# Patient Record
Sex: Female | Born: 1970 | Race: White | Hispanic: Yes | Marital: Married | Smoking: Never smoker
Health system: Southern US, Community
[De-identification: ages and names within clinical notes are randomized; demographics above are authoritative.]

## PROBLEM LIST (undated history)

## (undated) DIAGNOSIS — E785 Hyperlipidemia, unspecified: Secondary | ICD-10-CM

## (undated) HISTORY — DX: Hyperlipidemia, unspecified: E78.5

## (undated) HISTORY — PX: NO PAST SURGERIES: SHX2092

---

## 2010-02-19 ENCOUNTER — Encounter (INDEPENDENT_AMBULATORY_CARE_PROVIDER_SITE_OTHER): Payer: Self-pay | Admitting: Family Medicine

## 2010-02-19 ENCOUNTER — Ambulatory Visit: Payer: Self-pay | Admitting: Internal Medicine

## 2010-02-19 LAB — CONVERTED CEMR LAB
AST: 35 units/L (ref 0–37)
BUN: 11 mg/dL (ref 6–23)
Basophils Relative: 0 % (ref 0–1)
Calcium: 9.7 mg/dL (ref 8.4–10.5)
Chloride: 104 meq/L (ref 96–112)
Creatinine, Ser: 0.71 mg/dL (ref 0.40–1.20)
Eosinophils Absolute: 0.2 10*3/uL (ref 0.0–0.7)
HCT: 41 % (ref 36.0–46.0)
Hemoglobin: 14.5 g/dL (ref 12.0–15.0)
MCHC: 35.4 g/dL (ref 30.0–36.0)
MCV: 89.5 fL (ref 78.0–100.0)
Monocytes Absolute: 0.9 10*3/uL (ref 0.1–1.0)
Monocytes Relative: 10 % (ref 3–12)
Neutro Abs: 4.1 10*3/uL (ref 1.7–7.7)
RBC: 4.58 M/uL (ref 3.87–5.11)

## 2010-02-20 ENCOUNTER — Encounter (INDEPENDENT_AMBULATORY_CARE_PROVIDER_SITE_OTHER): Payer: Self-pay | Admitting: Family Medicine

## 2010-02-20 LAB — CONVERTED CEMR LAB
Hep B Core Total Ab: NEGATIVE
Hep B S Ab: NEGATIVE

## 2010-04-02 ENCOUNTER — Ambulatory Visit: Payer: Self-pay | Admitting: Internal Medicine

## 2010-04-02 ENCOUNTER — Encounter (INDEPENDENT_AMBULATORY_CARE_PROVIDER_SITE_OTHER): Payer: Self-pay | Admitting: Family Medicine

## 2010-04-02 LAB — CONVERTED CEMR LAB
ALT: 56 units/L — ABNORMAL HIGH (ref 0–35)
Cholesterol: 193 mg/dL (ref 0–200)
LDL Cholesterol: 116 mg/dL — ABNORMAL HIGH (ref 0–99)
VLDL: 40 mg/dL (ref 0–40)

## 2010-07-11 ENCOUNTER — Encounter (INDEPENDENT_AMBULATORY_CARE_PROVIDER_SITE_OTHER): Payer: Self-pay | Admitting: *Deleted

## 2010-07-11 LAB — CONVERTED CEMR LAB
Cholesterol: 190 mg/dL (ref 0–200)
Triglycerides: 133 mg/dL (ref <150)
VLDL: 27 mg/dL (ref 0–40)

## 2010-12-25 ENCOUNTER — Other Ambulatory Visit: Payer: Self-pay

## 2016-06-05 ENCOUNTER — Emergency Department (HOSPITAL_COMMUNITY)
Admission: EM | Admit: 2016-06-05 | Discharge: 2016-06-05 | Disposition: A | Discharge status: Home / Self Care | Payer: Medicaid Other | Attending: Emergency Medicine | Admitting: Emergency Medicine

## 2016-06-05 ENCOUNTER — Encounter (HOSPITAL_COMMUNITY): Payer: Self-pay

## 2016-06-05 ENCOUNTER — Emergency Department (HOSPITAL_COMMUNITY): Payer: Medicaid Other

## 2016-06-05 DIAGNOSIS — S339XXA Sprain of unspecified parts of lumbar spine and pelvis, initial encounter: Secondary | ICD-10-CM | POA: Diagnosis not present

## 2016-06-05 DIAGNOSIS — Y939 Activity, unspecified: Secondary | ICD-10-CM | POA: Diagnosis not present

## 2016-06-05 DIAGNOSIS — Y999 Unspecified external cause status: Secondary | ICD-10-CM | POA: Diagnosis not present

## 2016-06-05 DIAGNOSIS — S0990XA Unspecified injury of head, initial encounter: Secondary | ICD-10-CM | POA: Diagnosis present

## 2016-06-05 DIAGNOSIS — S060X0A Concussion without loss of consciousness, initial encounter: Secondary | ICD-10-CM | POA: Diagnosis not present

## 2016-06-05 DIAGNOSIS — Y92410 Unspecified street and highway as the place of occurrence of the external cause: Secondary | ICD-10-CM | POA: Diagnosis not present

## 2016-06-05 DIAGNOSIS — S335XXA Sprain of ligaments of lumbar spine, initial encounter: Secondary | ICD-10-CM

## 2016-06-05 DIAGNOSIS — S161XXA Strain of muscle, fascia and tendon at neck level, initial encounter: Secondary | ICD-10-CM | POA: Insufficient documentation

## 2016-06-05 LAB — I-STAT BETA HCG BLOOD, ED (MC, WL, AP ONLY)

## 2016-06-05 MED ORDER — HYDROCODONE-ACETAMINOPHEN 5-325 MG PO TABS
1.0000 | ORAL_TABLET | Freq: Once | ORAL | Status: AC
  Administered 2016-06-05: 1 via ORAL
  Filled 2016-06-05: qty 1

## 2016-06-05 MED ORDER — IBUPROFEN 800 MG PO TABS: 800.0000 mg | ORAL_TABLET | Freq: Three times a day (TID) | ORAL | 0 refills | Status: DC

## 2016-06-05 MED ORDER — HYDROCODONE-ACETAMINOPHEN 5-325 MG PO TABS: 2.0000 | ORAL_TABLET | Freq: Four times a day (QID) | ORAL | 0 refills | Status: DC | PRN

## 2016-06-05 MED ORDER — CYCLOBENZAPRINE HCL 10 MG PO TABS: 10.0000 mg | ORAL_TABLET | Freq: Two times a day (BID) | ORAL | 0 refills | Status: DC | PRN

## 2016-06-05 MED ORDER — DIAZEPAM 5 MG/ML IJ SOLN
5.0000 mg | Freq: Once | INTRAMUSCULAR | Status: AC
  Administered 2016-06-05: 5 mg via INTRAVENOUS
  Filled 2016-06-05: qty 2

## 2016-06-05 NOTE — ED Notes (Signed)
Triage note completed with video interpreter/[id number]

## 2016-06-05 NOTE — ED Notes (Signed)
Patient d/c'd with assistance of interpreter.  Patient medications and F/U reviewed.  Patient verbalized understanding.

## 2016-06-05 NOTE — ED Notes (Signed)
LSB and head block removed as per protocol. Patient denies any pain of the spine when palpated. Per interperter that pain is with movement.
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
## 2016-06-05 NOTE — ED Notes (Signed)
MD working on D/C paperwork.

## 2016-08-12 ENCOUNTER — Encounter: Payer: Self-pay | Admitting: Family Medicine

## 2016-08-12 ENCOUNTER — Ambulatory Visit: Payer: Medicaid Other | Attending: Family Medicine | Admitting: Family Medicine

## 2016-08-12 VITALS — BP 118/73 | HR 67 | Temp 98.4°F | Resp 18 | Ht 61.0 in | Wt 158.0 lb

## 2016-08-12 DIAGNOSIS — E785 Hyperlipidemia, unspecified: Secondary | ICD-10-CM | POA: Diagnosis present

## 2016-08-12 DIAGNOSIS — Z13228 Encounter for screening for other metabolic disorders: Secondary | ICD-10-CM | POA: Diagnosis not present

## 2016-08-12 DIAGNOSIS — Z1329 Encounter for screening for other suspected endocrine disorder: Secondary | ICD-10-CM | POA: Insufficient documentation

## 2016-08-12 DIAGNOSIS — L80 Vitiligo: Secondary | ICD-10-CM | POA: Insufficient documentation

## 2016-08-12 DIAGNOSIS — L298 Other pruritus: Secondary | ICD-10-CM

## 2016-08-12 DIAGNOSIS — L299 Pruritus, unspecified: Secondary | ICD-10-CM | POA: Diagnosis not present

## 2016-08-12 DIAGNOSIS — N898 Other specified noninflammatory disorders of vagina: Secondary | ICD-10-CM

## 2016-08-12 LAB — CBC WITH DIFFERENTIAL/PLATELET
BASOS PCT: 0 %
Basophils Absolute: 0 cells/uL (ref 0–200)
EOS ABS: 124 {cells}/uL (ref 15–500)
EOS PCT: 2 %
HCT: 44.6 % (ref 35.0–45.0)
Hemoglobin: 15.1 g/dL (ref 11.7–15.5)
LYMPHS PCT: 35 %
Lymphs Abs: 2170 cells/uL (ref 850–3900)
MCH: 31 pg (ref 27.0–33.0)
MCHC: 33.9 g/dL (ref 32.0–36.0)
MCV: 91.6 fL (ref 80.0–100.0)
MONOS PCT: 9 %
MPV: 9.7 fL (ref 7.5–12.5)
Monocytes Absolute: 558 cells/uL (ref 200–950)
NEUTROS ABS: 3348 {cells}/uL (ref 1500–7800)
Neutrophils Relative %: 54 %
PLATELETS: 331 10*3/uL (ref 140–400)
RBC: 4.87 MIL/uL (ref 3.80–5.10)
RDW: 12.9 % (ref 11.0–15.0)
WBC: 6.2 10*3/uL (ref 3.8–10.8)

## 2016-08-12 LAB — LIPID PANEL W/REFLEX DIRECT LDL
CHOL/HDL RATIO: 5.4 ratio — AB (ref <5.0)
CHOLESTEROL: 195 mg/dL (ref <200)
HDL: 36 mg/dL — ABNORMAL LOW (ref >50)
LDL-CHOLESTEROL: 119 mg/dL — AB
NON-HDL CHOLESTEROL (CALC): 159 mg/dL — AB (ref <130)
Triglycerides: 282 mg/dL — ABNORMAL HIGH (ref <150)

## 2016-08-12 LAB — COMPLETE METABOLIC PANEL WITH GFR
ALT: 24 U/L (ref 6–29)
AST: 20 U/L (ref 10–35)
Albumin: 4.5 g/dL (ref 3.6–5.1)
Alkaline Phosphatase: 63 U/L (ref 33–115)
BUN: 9 mg/dL (ref 7–25)
CALCIUM: 9.5 mg/dL (ref 8.6–10.2)
CHLORIDE: 103 mmol/L (ref 98–110)
CO2: 26 mmol/L (ref 20–31)
Creat: 0.49 mg/dL — ABNORMAL LOW (ref 0.50–1.10)
GFR, Est African American: >89 mL/min (ref >=60)
GFR, Est Non African American: >89 mL/min (ref >=60)
GLUCOSE: 115 mg/dL — AB (ref 65–99)
POTASSIUM: 4.2 mmol/L (ref 3.5–5.3)
SODIUM: 139 mmol/L (ref 135–146)
Total Bilirubin: 0.5 mg/dL (ref 0.2–1.2)
Total Protein: 7.3 g/dL (ref 6.1–8.1)

## 2016-08-12 MED ORDER — CLOTRIMAZOLE 1 % EX CREA: 1.0000 "application " | TOPICAL_CREAM | Freq: Two times a day (BID) | CUTANEOUS | 1 refills | Status: AC

## 2016-08-12 NOTE — Progress Notes (Signed)
Subjective:  Patient ID: Debbie Lawson, female    DOB: [birth year]  Age: 46 y.o. MRN: [id number]  CC: Hyperlipidemia   HPI Debbie Lawson is a 46 year old female who presents today to establish care. She complains of a hypopigmented rash which appears and disappears on her left forearm, right abdomen and external genitalia. Rash in the external genitalia is pruritic but the others are not.  She denies vaginal discharge or urinary symptoms.  No past medical history on file.  No past surgical history on file.  No Known Allergies  Outpatient Medications Prior to Visit  Medication Sig Dispense Refill  . Cholecalciferol (VITAMIN D PO) Take 1 tablet by mouth daily.    . cyclobenzaprine (FLEXERIL) 10 MG tablet Take 1 tablet (10 mg total) by mouth 2 (two) times daily as needed for muscle spasms. 20 tablet 0  . ibuprofen (ADVIL,MOTRIN) 800 MG tablet Take 1 tablet (800 mg total) by mouth 3 (three) times daily. 21 tablet 0  . HYDROcodone-acetaminophen (NORCO/VICODIN) 5-325 MG tablet Take 2 tablets by mouth every 6 (six) hours as needed for severe pain. 10 tablet 0   No facility-administered medications prior to visit.     ROS Review of Systems  Constitutional: Negative for activity change, appetite change and fatigue.  HENT: Negative for congestion, sinus pressure and sore throat.   Eyes: Negative for visual disturbance.  Respiratory: Negative for cough, chest tightness, shortness of breath and wheezing.   Cardiovascular: Negative for chest pain and palpitations.  Gastrointestinal: Negative for abdominal distention, abdominal pain and constipation.  Endocrine: Negative for polydipsia.  Genitourinary: Negative for dysuria and frequency.       Vaginal itching  Musculoskeletal: Negative for arthralgias and back pain.  Skin: Positive for rash.  Neurological: Negative for tremors, light-headedness and numbness.  Hematological: Does not bruise/bleed easily.    Psychiatric/Behavioral: Negative for agitation and behavioral problems.    Objective:  BP 118/73 (BP Location: Right Arm, Patient Position: Sitting, Cuff Size: Normal)   Pulse 67   Temp 98.4 F (36.9 C) (Oral)   Resp 18   Ht 5\' 1"  (1.549 m)   Wt 158 lb (71.7 kg)   LMP 08/10/2016   SpO2 99%   BMI 29.85 kg/m   BP/Weight 08/12/2016 0000000  Systolic BP 123456 [id number]  Diastolic BP 73 66  Wt. (Lbs) 158 150  BMI 29.85 24.21      Physical Exam  Constitutional: She is oriented to person, place, and time. She appears well-developed and well-nourished.  Cardiovascular: Normal rate, normal heart sounds and intact distal pulses.   No murmur heard. Pulmonary/Chest: Effort normal and breath sounds normal. She has no wheezes. She has no rales. She exhibits no tenderness.  Abdominal: Soft. Bowel sounds are normal. She exhibits no distension and no mass. There is no tenderness.  Musculoskeletal: Normal range of motion.  Neurological: She is alert and oriented to person, place, and time.  Skin:  Hypo pigmentation on flexor aspect of the left forearm, right inguinal region and left labia majora     Assessment & Plan:   1. Screening for metabolic disorder - COMPLETE METABOLIC PANEL WITH GFR - Lipid Panel w/reflex Direct LDL - CBC with Differential/Platelet  2. Vitiligo Appearance and disappearance of rash likely seasonal  3. Vagina itching - clotrimazole (LOTRIMIN) 1 % cream; Apply 1 application topically 2 (two) times daily.  Dispense: 45 g; Refill: 1   Meds ordered this encounter  Medications  . clotrimazole (LOTRIMIN) 1 % cream  Sig: Apply 1 application topically 2 (two) times daily.    Dispense:  45 g    Refill:  1    Follow-up: Return in about 2 weeks (around 08/26/2016) for a complete physical exam.   Arnoldo Morale MD

## 2016-08-13 ENCOUNTER — Other Ambulatory Visit: Payer: Self-pay | Admitting: Family Medicine

## 2016-08-13 MED ORDER — ATORVASTATIN CALCIUM 20 MG PO TABS: 20.0000 mg | ORAL_TABLET | Freq: Every day | ORAL | 3 refills | Status: DC

## 2016-08-22 ENCOUNTER — Ambulatory Visit: Payer: Medicaid Other | Attending: Family Medicine | Admitting: Family Medicine

## 2016-08-22 ENCOUNTER — Encounter: Payer: Self-pay | Admitting: Family Medicine

## 2016-08-22 ENCOUNTER — Other Ambulatory Visit (HOSPITAL_COMMUNITY)
Admission: RE | Admit: 2016-08-22 | Discharge: 2016-08-22 | Disposition: A | Discharge status: Home / Self Care | Payer: Medicaid Other | Source: Ambulatory Visit | Attending: Family Medicine | Admitting: Family Medicine

## 2016-08-22 VITALS — BP 112/70 | HR 71 | Temp 97.8°F | Ht 61.25 in | Wt 157.2 lb

## 2016-08-22 DIAGNOSIS — Z1239 Encounter for other screening for malignant neoplasm of breast: Secondary | ICD-10-CM

## 2016-08-22 DIAGNOSIS — E785 Hyperlipidemia, unspecified: Secondary | ICD-10-CM | POA: Diagnosis not present

## 2016-08-22 DIAGNOSIS — Z01419 Encounter for gynecological examination (general) (routine) without abnormal findings: Secondary | ICD-10-CM | POA: Insufficient documentation

## 2016-08-22 DIAGNOSIS — Z1151 Encounter for screening for human papillomavirus (HPV): Secondary | ICD-10-CM | POA: Insufficient documentation

## 2016-08-22 DIAGNOSIS — Z124 Encounter for screening for malignant neoplasm of cervix: Secondary | ICD-10-CM

## 2016-08-22 DIAGNOSIS — R0981 Nasal congestion: Secondary | ICD-10-CM | POA: Insufficient documentation

## 2016-08-22 DIAGNOSIS — Z79899 Other long term (current) drug therapy: Secondary | ICD-10-CM | POA: Insufficient documentation

## 2016-08-22 DIAGNOSIS — Z0000 Encounter for general adult medical examination without abnormal findings: Secondary | ICD-10-CM

## 2016-08-22 DIAGNOSIS — Z1231 Encounter for screening mammogram for malignant neoplasm of breast: Secondary | ICD-10-CM

## 2016-08-22 MED ORDER — ATORVASTATIN CALCIUM 20 MG PO TABS: 20.0000 mg | ORAL_TABLET | Freq: Every day | ORAL | 3 refills | Status: DC

## 2016-08-22 NOTE — Progress Notes (Signed)
Subjective:  Patient ID: Debbie Lawson, female    DOB: August 03, 1970  Age: 46 y.o. MRN: [id number]  CC: Annual Exam; Nasal Congestion; and Hyperlipidemia   HPI Debbie Lawson presents for A complete physical exam. She needs a refill on atorvastatin. Also has some nasal congestion and is using OTC remedies.  No past medical history on file.  No past surgical history on file.  No Known Allergies   Outpatient Medications Prior to Visit  Medication Sig Dispense Refill  . atorvastatin (LIPITOR) 20 MG tablet Take 1 tablet (20 mg total) by mouth daily. 30 tablet 3  . Cholecalciferol (VITAMIN D PO) Take 1 tablet by mouth daily.    . clotrimazole (LOTRIMIN) 1 % cream Apply 1 application topically 2 (two) times daily. 45 g 1  . cyclobenzaprine (FLEXERIL) 10 MG tablet Take 1 tablet (10 mg total) by mouth 2 (two) times daily as needed for muscle spasms. 20 tablet 0  . HYDROcodone-acetaminophen (NORCO/VICODIN) 5-325 MG tablet Take 2 tablets by mouth every 6 (six) hours as needed for severe pain. 10 tablet 0  . ibuprofen (ADVIL,MOTRIN) 800 MG tablet Take 1 tablet (800 mg total) by mouth 3 (three) times daily. 21 tablet 0   No facility-administered medications prior to visit.     ROS Review of Systems  Constitutional: Negative for activity change, appetite change and fatigue.  HENT: Positive for congestion. Negative for sinus pressure and sore throat.   Eyes: Negative for visual disturbance.  Respiratory: Negative for cough, chest tightness, shortness of breath and wheezing.   Cardiovascular: Negative for chest pain and palpitations.  Gastrointestinal: Negative for abdominal distention, abdominal pain and constipation.  Endocrine: Negative for polydipsia.  Genitourinary: Negative for dysuria and frequency.  Musculoskeletal: Negative for arthralgias and back pain.  Skin: Negative for rash.  Neurological: Negative for tremors, light-headedness and numbness.  Hematological:  Does not bruise/bleed easily.  Psychiatric/Behavioral: Negative for agitation and behavioral problems.    Objective:  BP 112/70 (BP Location: Right Arm, Patient Position: Sitting, Cuff Size: Small)   Pulse 71   Temp 97.8 F (36.6 C) (Oral)   Ht 5' 1.25" (1.556 m)   Wt 157 lb 3.2 oz (71.3 kg)   LMP 08/10/2016   SpO2 100%   BMI 29.46 kg/m   BP/Weight 08/22/2016 08/12/2016 0000000  Systolic BP [id number] 123456 [id number]  Diastolic BP 70 73 66  Wt. (Lbs) 157.2 158 150  BMI 29.46 29.85 24.21      Physical Exam  Constitutional: She is oriented to person, place, and time. She appears well-developed and well-nourished. No distress.  HENT:  Head: Normocephalic.  Right Ear: External ear normal.  Left Ear: External ear normal.  Nose: Nose normal.  Mouth/Throat: Oropharynx is clear and moist.  Eyes: Conjunctivae and EOM are normal. Pupils are equal, round, and reactive to light.  Neck: Normal range of motion. No JVD present.  Cardiovascular: Normal rate, regular rhythm, normal heart sounds and intact distal pulses.  Exam reveals no gallop.   No murmur heard. Pulmonary/Chest: Effort normal and breath sounds normal. No respiratory distress. She has no wheezes. She has no rales. She exhibits no tenderness. Right breast exhibits no mass and no tenderness. Left breast exhibits no mass and no tenderness.  Abdominal: Soft. Bowel sounds are normal. She exhibits no distension and no mass. There is no tenderness.  Genitourinary:  Genitourinary Comments: External genitalia, vagina, cervix, adnexa all normal  Musculoskeletal: Normal range of motion. She exhibits no edema or tenderness.  Neurological:  She is alert and oriented to person, place, and time. She has normal reflexes.  Skin: Skin is warm and dry. She is not diaphoretic.  Psychiatric: She has a normal mood and affect.   CMP Latest Ref Rng & Units 08/12/2016 07/11/2010 04/02/2010  Glucose 65 - 99 mg/dL 115(H) - -  BUN 7 - 25 mg/dL 9 - -  Creatinine  0.50 - 1.10 mg/dL 0.49(L) - -  Sodium 135 - 146 mmol/L 139 - -  Potassium 3.5 - 5.3 mmol/L 4.2 - -  Chloride 98 - 110 mmol/L 103 - -  CO2 20 - 31 mmol/L 26 - -  Calcium 8.6 - 10.2 mg/dL 9.5 - -  Total Protein 6.1 - 8.1 g/dL 7.3 - -  Total Bilirubin 0.2 - 1.2 mg/dL 0.5 - -  Alkaline Phos 33 - 115 U/L 63 - -  AST 10 - 35 U/L 20 - -  ALT 6 - 29 U/L 24 32 56(H)    Lipid Panel     Component Value Date/Time   CHOL 195 08/12/2016 0932   TRIG 282 (H) 08/12/2016 0932   HDL 36 (L) 08/12/2016 0932   CHOLHDL 5.4 (H) 08/12/2016 0932   VLDL 27 07/11/2010 2115   LDLCALC 120 (H) 07/11/2010 2115     Assessment & Plan:   1. Screening for breast cancer - [facility] DIGITAL SCREENING BILATERAL; Future  2. Screening for cervical cancer - Cytology - PAP [place]  3. Annual physical exam    No orders of the defined types were placed in this encounter.   Follow-up: Return in about 6 months (around 02/19/2017) for coordination of care.   Arnoldo Morale MD

## 2016-08-26 LAB — CYTOLOGY - PAP
Diagnosis: NEGATIVE
HPV (WINDOPATH): NOT DETECTED

## 2016-09-26 ENCOUNTER — Ambulatory Visit
Admission: RE | Admit: 2016-09-26 | Discharge: 2016-09-26 | Disposition: A | Discharge status: Home / Self Care | Payer: Medicaid Other | Source: Ambulatory Visit | Attending: Family Medicine | Admitting: Family Medicine

## 2016-09-26 DIAGNOSIS — Z1231 Encounter for screening mammogram for malignant neoplasm of breast: Secondary | ICD-10-CM | POA: Diagnosis not present

## 2016-09-26 DIAGNOSIS — Z1239 Encounter for other screening for malignant neoplasm of breast: Secondary | ICD-10-CM

## 2016-10-01 ENCOUNTER — Telehealth: Payer: Self-pay

## 2016-10-01 NOTE — Telephone Encounter (Signed)
-----   Message from Arnoldo Morale, MD sent at 09/27/2016  1:03 PM EST ----- Mammogram is negative for malignancy

## 2016-10-01 NOTE — Telephone Encounter (Signed)
Through [employer] patient was called and test results given to her.  Patient stated understanding.

## 2016-12-20 ENCOUNTER — Encounter: Payer: Self-pay | Admitting: Physician Assistant

## 2016-12-20 ENCOUNTER — Other Ambulatory Visit: Payer: Self-pay | Admitting: Physician Assistant

## 2016-12-20 ENCOUNTER — Ambulatory Visit: Payer: Medicaid Other | Attending: Family Medicine | Admitting: Physician Assistant

## 2016-12-20 VITALS — BP 101/62 | HR 69 | Temp 98.2°F | Resp 18 | Ht 61.0 in | Wt 157.0 lb

## 2016-12-20 DIAGNOSIS — E041 Nontoxic single thyroid nodule: Secondary | ICD-10-CM

## 2016-12-20 DIAGNOSIS — Z79899 Other long term (current) drug therapy: Secondary | ICD-10-CM | POA: Diagnosis not present

## 2016-12-20 DIAGNOSIS — R5383 Other fatigue: Secondary | ICD-10-CM | POA: Diagnosis not present

## 2016-12-20 DIAGNOSIS — M79671 Pain in right foot: Secondary | ICD-10-CM

## 2016-12-20 DIAGNOSIS — M79672 Pain in left foot: Secondary | ICD-10-CM

## 2016-12-20 DIAGNOSIS — E785 Hyperlipidemia, unspecified: Secondary | ICD-10-CM

## 2016-12-20 DIAGNOSIS — R739 Hyperglycemia, unspecified: Secondary | ICD-10-CM

## 2016-12-20 MED ORDER — ATORVASTATIN CALCIUM 20 MG PO TABS: 20.0000 mg | ORAL_TABLET | Freq: Every day | ORAL | 3 refills | Status: DC

## 2016-12-20 MED ORDER — IBUPROFEN 800 MG PO TABS: 800.0000 mg | ORAL_TABLET | Freq: Three times a day (TID) | ORAL | 0 refills | Status: DC
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
## 2016-12-21 LAB — BASIC METABOLIC PANEL
BUN / CREAT RATIO: 18 (ref 9–23)
BUN: 10 mg/dL (ref 6–24)
CALCIUM: 9.1 mg/dL (ref 8.7–10.2)
CHLORIDE: 103 mmol/L (ref 96–106)
CO2: 21 mmol/L (ref 18–29)
CREATININE: 0.56 mg/dL — AB (ref 0.57–1.00)
GFR calc non Af Amer: 112 mL/min/{1.73_m2} (ref >59)
GFR, EST AFRICAN AMERICAN: 129 mL/min/{1.73_m2} (ref >59)
GLUCOSE: 118 mg/dL — AB (ref 65–99)
Potassium: 4.3 mmol/L (ref 3.5–5.2)
Sodium: 139 mmol/L (ref 134–144)

## 2016-12-21 LAB — TSH: TSH: 0.733 u[IU]/mL (ref 0.450–4.500)

## 2016-12-27 ENCOUNTER — Ambulatory Visit (HOSPITAL_COMMUNITY)
Admission: RE | Admit: 2016-12-27 | Discharge: 2016-12-27 | Disposition: A | Discharge status: Home / Self Care | Payer: Medicaid Other | Source: Ambulatory Visit | Attending: Physician Assistant | Admitting: Physician Assistant

## 2016-12-27 DIAGNOSIS — E041 Nontoxic single thyroid nodule: Secondary | ICD-10-CM | POA: Diagnosis not present

## 2016-12-30 ENCOUNTER — Other Ambulatory Visit: Payer: Self-pay | Admitting: Physician Assistant

## 2016-12-30 DIAGNOSIS — E042 Nontoxic multinodular goiter: Secondary | ICD-10-CM

## 2017-01-23 ENCOUNTER — Telehealth: Payer: Self-pay

## 2017-01-23 NOTE — Telephone Encounter (Signed)
CMA call regarding results  Patient verify DOB  Patient was aware and understood  

## 2017-01-23 NOTE — Telephone Encounter (Signed)
-----   Message from [place], [place] sent at 01/23/2017  3:56 PM EDT ----- Patients thyroid shows some nodules. She has been referred to a specialist for further testing.

## 2017-02-19 DIAGNOSIS — E042 Nontoxic multinodular goiter: Secondary | ICD-10-CM | POA: Diagnosis not present

## 2017-02-25 ENCOUNTER — Other Ambulatory Visit: Payer: Self-pay | Admitting: Endocrinology

## 2017-02-25 DIAGNOSIS — E042 Nontoxic multinodular goiter: Secondary | ICD-10-CM

## 2017-04-14 ENCOUNTER — Encounter: Payer: Self-pay | Admitting: Family Medicine

## 2017-04-14 ENCOUNTER — Ambulatory Visit: Payer: Medicaid Other | Attending: Family Medicine | Admitting: Family Medicine

## 2017-04-14 VITALS — BP 117/73 | HR 67 | Temp 98.2°F | Ht 61.0 in | Wt 157.8 lb

## 2017-04-14 DIAGNOSIS — E041 Nontoxic single thyroid nodule: Secondary | ICD-10-CM | POA: Diagnosis not present

## 2017-04-14 DIAGNOSIS — Z23 Encounter for immunization: Secondary | ICD-10-CM | POA: Diagnosis not present

## 2017-04-14 DIAGNOSIS — E785 Hyperlipidemia, unspecified: Secondary | ICD-10-CM | POA: Insufficient documentation

## 2017-04-14 DIAGNOSIS — Z131 Encounter for screening for diabetes mellitus: Secondary | ICD-10-CM

## 2017-04-14 DIAGNOSIS — R7303 Prediabetes: Secondary | ICD-10-CM | POA: Insufficient documentation

## 2017-04-14 LAB — POCT GLYCOSYLATED HEMOGLOBIN (HGB A1C): Hemoglobin A1C: 6.3

## 2017-04-14 MED ORDER — ATORVASTATIN CALCIUM 20 MG PO TABS: 20.0000 mg | ORAL_TABLET | Freq: Every day | ORAL | 6 refills | Status: DC

## 2017-04-14 NOTE — Progress Notes (Signed)
Subjective:  Patient ID: Debbie Lawson, female    DOB: March 10, 1971  Age: 46 y.o. MRN: [id number]  CC: Hyperlipidemia   HPI Devan-Flores Buller is a 46 year old female with Hyperlipidemia who presents for a follow up visit. She tolerates her statin and denies side effects; also compliant with a low cholesterol diet.  She is scheduled for a thyroid biopsy to further evaluate the thyroid nodule identified from her thyroid ultrasound. Denies chest pains or shortness of breath.  She has no concerns today and is wiling to receive the flu shot.  No past medical history on file.  Past Surgical History:  Procedure Laterality Date  . NO PAST SURGERIES      No Known Allergies    Outpatient Medications Prior to Visit  Medication Sig Dispense Refill  . Cholecalciferol (VITAMIN D PO) Take 1 tablet by mouth daily.    . clotrimazole (LOTRIMIN) 1 % cream Apply 1 application topically 2 (two) times daily. 45 g 1  . ibuprofen (ADVIL,MOTRIN) 800 MG tablet Take 1 tablet (800 mg total) by mouth 3 (three) times daily. 60 tablet 0  . atorvastatin (LIPITOR) 20 MG tablet Take 1 tablet (20 mg total) by mouth daily. 30 tablet 3   No facility-administered medications prior to visit.     ROS Review of Systems  Constitutional: Negative for activity change, appetite change and fatigue.  HENT: Negative for congestion, sinus pressure and sore throat.   Eyes: Negative for visual disturbance.  Respiratory: Negative for cough, chest tightness, shortness of breath and wheezing.   Cardiovascular: Negative for chest pain and palpitations.  Gastrointestinal: Negative for abdominal distention, abdominal pain and constipation.  Endocrine: Negative for polydipsia.  Genitourinary: Negative for dysuria and frequency.  Musculoskeletal: Negative for arthralgias and back pain.  Skin: Negative for rash.  Neurological: Negative for tremors, light-headedness and numbness.  Hematological: Does not  bruise/bleed easily.  Psychiatric/Behavioral: Negative for agitation and behavioral problems.    Objective:  BP 117/73   Pulse 67   Temp 98.2 F (36.8 C) (Oral)   Ht _0  (1.549 m)   Wt 157 lb 12.8 oz (71.6 kg)   SpO2 99%   BMI 29.82 kg/m   BP/Weight 04/14/2017 11/24/3746 08/28/784  Systolic BP 754 492 010  Diastolic BP 73 62 70  Wt. (Lbs) 157.8 157 157.2  BMI 29.82 29.66 29.46     Physical Exam  Constitutional: She is oriented to person, place, and time. She appears well-developed and well-nourished.  Cardiovascular: Normal rate, normal heart sounds and intact distal pulses.   No murmur heard. Pulmonary/Chest: Effort normal and breath sounds normal. She has no wheezes. She has no rales. She exhibits no tenderness.  Abdominal: Soft. Bowel sounds are normal. She exhibits no distension and no mass. There is no tenderness.  Musculoskeletal: Normal range of motion.  Neurological: She is alert and oriented to person, place, and time.      Lab Results  Component Value Date   HGBA1C 6.3 04/14/2017    Assessment & Plan:   1. Hyperlipidemia, unspecified hyperlipidemia type Stable Low chol diet, lifestyle modifications - atorvastatin (LIPITOR) 20 MG tablet; Take 1 tablet (20 mg total) by mouth daily.  Dispense: 30 tablet; Refill: 6 - CMP14+EGFR - Lipid panel  2. Screening for diabetes mellitus Pre Diabetic with A1c of 6.3  3. Need for influenza vaccination Flu shot administered  4. Thyroid nodule Scheduled for thyroid biopsy on 04/24/17   Meds ordered this encounter  Medications  .  atorvastatin (LIPITOR) 20 MG tablet    Sig: Take 1 tablet (20 mg total) by mouth daily.    Dispense:  30 tablet    Refill:  6    Follow-up: Return in about 6 months (around 10/12/2017) for follow up of Hyperlipidemia.   Arnoldo Morale MD

## 2017-04-14 NOTE — Patient Instructions (Signed)
Opciones de alimentos para bajar el nivel de triglicridos  (Food Choices to Lower Your Triglycerides)  Los triglicridos son un tipo de grasas que se encuentran en la sangre. Un nivel elevado de triglicridos puede aumentar el riesgo de padecer enfermedades cardacas e infartos. Si sus niveles de triglicridos son altos, los alimentos que se ingieren y los hbitos de alimentacin son muy importantes. Elegir los alimentos adecuados puede ayudar a bajar el nivel de triglicridos.  QU PAUTAS GENERALES DEBO SEGUIR?   Baje de peso si es necesario.   Limite o evite el alcohol.   Llene la mitad del plato con vegetales y ensaladas de hojas verdes.   Limite las frutas a dos porciones por da. Elija frutas en lugar de jugos.   Ocupe un cuarto del plato con cereales integrales. Busque la palabra "integral" en el primer lugar de la lista de ingredientes.   Llene un cuarto del plato con alimentos con protenas magras.   Disfrute de pescados grasos (como salmn, caballa, sardinas y atn) tres veces por semana.   Elija las grasas saludables.   Limite los alimentos con alto contenido de almidn y azcar.   Consuma ms comida casera y menos de restaurante, de buf y comida rpida.   Limite el consumo de alimentos fritos.   Cocine los alimentos utilizando mtodos que no sean la fritura.   Limite el consumo de grasas saturadas.   Verifique las listas de ingredientes para evitar alimentos con aceites parcialmente hidrogenados (grasas trans).    QU ALIMENTOS PUEDO COMER?  Cereales  Cereales integrales, como los panes de salvado o integrales, las galletas, los cereales y las pastas. Avena sin endulzar, trigo, cebada, quinua o arroz integral. Tortillas de harina de maz o de salvado.  Vegetales  Verduras frescas o congeladas (crudas, al vapor, asadas o grilladas). Ensaladas de hojas verdes.  Fruits  Frutas frescas, en conserva (en su jugo natural) o frutas congeladas.  Carnes y otros productos con protenas  Carne de  res molida (al 85% o ms magra), carne de res de animales alimentados con pastos o carne de res sin la grasa. Pollo o pavo sin piel. Carne de pollo o de pavo molida. Cerdo sin la grasa. Todos los pescados y frutos de mar. Huevos. Porotos, guisantes o lentejas secos. Frutos secos o semillas sin sal. Frijoles secos o en lata sin sal.  Lcteos  Productos lcteos con bajo contenido de grasas, como leche descremada o al 1%, quesos reducidos en grasas o al 2%, ricota con bajo contenido de grasas o queso cottage, o yogur natural con bajo contenido de grasas.  Grasas y aceites  Margarinas en barra que no contengan grasas trans. [place]a y condimentos para ensaladas livianos o reducidos en grasas. [place]. Aceites de crtamo, oliva o canola. Mantequilla natural de man o almendra.  Los artculos mencionados arriba pueden no ser una lista completa de las bebidas o los alimentos recomendados. Comunquese con el nutricionista para conocer ms opciones.  QU ALIMENTOS NO SE RECOMIENDAN?  Cereales  Pan blanco. Pastas blancas. Arroz blanco. Pan de maz. Bagels, pasteles y croissants. Galletas saladas que contengan grasas trans.  Vegetales  Papas blancas. Maz. Vegetales con crema o fritos. Verduras en salsa de queso.  Fruits  Frutas secas. Fruta enlatada en almbar liviano o espeso. Jugo de frutas.  Carnes y otros productos con protenas  Cortes de carne con grasa. Costillas, alas de pollo, tocineta, salchicha, mortadela, salame, chinchulines, tocino, perros calientes, salchichas alemanas y embutidos envasados.  Lcteos  Leche   entera o al 2%, crema, mezcla de leche y crema y queso crema. Yogur entero o endulzado. Quesos con toda su grasa. Cremas no lcteas y coberturas batidas. Quesos procesados, quesos para untar o cuajadas.  Dulces y postres  Jarabe de maz, azcares, miel y melazas. Caramelos. Mermelada y jalea. Jarabe. Cereales endulzados. Galletas, pasteles, bizcochuelos, donas, muffins y helado.  Grasas y  aceites  Mantequilla, margarina en barra, manteca de cerdo, grasa, mantequilla clarificada o grasa de tocino. Aceites de coco, de palmiste o de palma.  Bebidas  Alcohol. Bebidas endulzadas (como refrescos, limonadas y bebidas frutales o ponches).  Los artculos mencionados arriba pueden no ser una lista completa de las bebidas y los alimentos que se deben evitar. Comunquese con el nutricionista para recibir ms informacin.  Esta informacin no tiene como fin reemplazar el consejo del mdico. Asegrese de hacerle al mdico cualquier pregunta que tenga.  Document Released: 12/26/2009 Document Revised: 07/13/2013 Document Reviewed: 05/12/2013  Elsevier Interactive Patient Education  2017 [employer] Inc[employer]

## 2017-04-15 ENCOUNTER — Encounter: Payer: Self-pay | Admitting: Family Medicine

## 2017-04-15 DIAGNOSIS — E785 Hyperlipidemia, unspecified: Secondary | ICD-10-CM

## 2017-04-15 HISTORY — DX: Hyperlipidemia, unspecified: E78.5

## 2017-04-15 LAB — CMP14+EGFR
A/G RATIO: 1.9 (ref 1.2–2.2)
ALBUMIN: 4.5 g/dL (ref 3.5–5.5)
ALT: 19 IU/L (ref 0–32)
AST: 15 IU/L (ref 0–40)
Alkaline Phosphatase: 59 IU/L (ref 39–117)
BUN / CREAT RATIO: 11 (ref 9–23)
BUN: 7 mg/dL (ref 6–24)
Bilirubin Total: 0.6 mg/dL (ref 0.0–1.2)
CALCIUM: 9.6 mg/dL (ref 8.7–10.2)
CO2: 20 mmol/L (ref 20–29)
CREATININE: 0.65 mg/dL (ref 0.57–1.00)
Chloride: 104 mmol/L (ref 96–106)
GFR calc Af Amer: 123 mL/min/{1.73_m2} (ref >59)
GFR, EST NON AFRICAN AMERICAN: 107 mL/min/{1.73_m2} (ref >59)
GLOBULIN, TOTAL: 2.4 g/dL (ref 1.5–4.5)
Glucose: 117 mg/dL — ABNORMAL HIGH (ref 65–99)
Potassium: 4.1 mmol/L (ref 3.5–5.2)
SODIUM: 140 mmol/L (ref 134–144)
Total Protein: 6.9 g/dL (ref 6.0–8.5)

## 2017-04-15 LAB — LIPID PANEL
CHOL/HDL RATIO: 3.2 ratio (ref 0.0–4.4)
Cholesterol, Total: 128 mg/dL (ref 100–199)
HDL: 40 mg/dL (ref >39)
LDL CALC: 60 mg/dL (ref 0–99)
Triglycerides: 141 mg/dL (ref 0–149)
VLDL Cholesterol Cal: 28 mg/dL (ref 5–40)

## 2017-04-22 ENCOUNTER — Other Ambulatory Visit: Payer: Self-pay | Admitting: Endocrinology

## 2017-04-22 ENCOUNTER — Telehealth: Payer: Self-pay

## 2017-04-22 DIAGNOSIS — E042 Nontoxic multinodular goiter: Secondary | ICD-10-CM

## 2017-04-22 NOTE — Telephone Encounter (Signed)
Pt was called and informed of lab results. 

## 2017-04-24 ENCOUNTER — Ambulatory Visit
Admission: RE | Admit: 2017-04-24 | Discharge: 2017-04-24 | Disposition: A | Discharge status: Home / Self Care | Payer: Medicaid Other | Source: Ambulatory Visit | Attending: Endocrinology | Admitting: Endocrinology

## 2017-04-24 ENCOUNTER — Other Ambulatory Visit (HOSPITAL_COMMUNITY)
Admission: RE | Admit: 2017-04-24 | Discharge: 2017-04-24 | Disposition: A | Discharge status: Home / Self Care | Payer: Medicaid Other | Source: Ambulatory Visit | Attending: Radiology | Admitting: Radiology

## 2017-04-24 DIAGNOSIS — E042 Nontoxic multinodular goiter: Secondary | ICD-10-CM | POA: Insufficient documentation

## 2017-12-01 ENCOUNTER — Ambulatory Visit: Payer: Medicaid Other | Attending: Family Medicine | Admitting: Family Medicine

## 2017-12-01 VITALS — BP 113/72 | HR 66 | Temp 98.0°F | Ht 61.0 in | Wt 161.0 lb

## 2017-12-01 DIAGNOSIS — D497 Neoplasm of unspecified behavior of endocrine glands and other parts of nervous system: Secondary | ICD-10-CM | POA: Diagnosis not present

## 2017-12-01 DIAGNOSIS — R7303 Prediabetes: Secondary | ICD-10-CM | POA: Insufficient documentation

## 2017-12-01 DIAGNOSIS — E119 Type 2 diabetes mellitus without complications: Secondary | ICD-10-CM | POA: Diagnosis not present

## 2017-12-01 DIAGNOSIS — Z79899 Other long term (current) drug therapy: Secondary | ICD-10-CM | POA: Diagnosis not present

## 2017-12-01 DIAGNOSIS — E785 Hyperlipidemia, unspecified: Secondary | ICD-10-CM | POA: Diagnosis not present

## 2017-12-01 LAB — POCT GLYCOSYLATED HEMOGLOBIN (HGB A1C): HEMOGLOBIN A1C: 6.6

## 2017-12-01 MED ORDER — GLUCOSE BLOOD VI STRP: ORAL_STRIP | 12 refills | Status: DC

## 2017-12-01 MED ORDER — ACCU-CHEK AVIVA DEVI: 0 refills | Status: AC

## 2017-12-01 MED ORDER — METFORMIN HCL 500 MG PO TABS: 500.0000 mg | ORAL_TABLET | Freq: Two times a day (BID) | ORAL | 3 refills | Status: DC

## 2017-12-01 MED ORDER — ATORVASTATIN CALCIUM 20 MG PO TABS: 20.0000 mg | ORAL_TABLET | Freq: Every day | ORAL | 6 refills | Status: DC

## 2017-12-01 MED ORDER — ACCU-CHEK SOFTCLIX LANCET DEV KIT: 1.0000 | PACK | Freq: Every day | 0 refills | Status: DC

## 2017-12-01 NOTE — Progress Notes (Signed)
Subjective:  Patient ID: Debbie Lawson, female    DOB: [birth year]  Age: 47 y.o. MRN: [id number]  CC: Hyperlipidemia   HPI Debbie Lawson is a 47 year old female with a history of hyperlipidemia who presents today for follow-up visit. She has a history of prediabetes with previous A1c of 6.3 which came back at 6.6 today with a new diagnosis of type 2 diabetes mellitus. She is doing well on her statin and has been compliant with no complaints of myalgias or other adverse effects She underwent biopsy of a thyroid nodule in 04/2017 but never followed up with endocrinology and informs me she was told she would receive a follow-up appointment. 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 Hyperlipidemia 04/15/2017    Past Surgical History:  Procedure Laterality Date  . NO PAST SURGERIES      No Known Allergies   Outpatient Medications Prior to Visit  Medication Sig Dispense Refill  . Cholecalciferol (VITAMIN D PO) Take 1 tablet by mouth daily.    . clotrimazole (LOTRIMIN) 1 % cream Apply 1 application topically 2 (two) times daily. 45 g 1  . ibuprofen (ADVIL,MOTRIN) 800 MG tablet Take 1 tablet (800 mg total) by mouth 3 (three) times daily. 60 tablet 0  . atorvastatin (LIPITOR) 20 MG tablet Take 1 tablet (20 mg total) by mouth daily. 30 tablet 6   No facility-administered medications prior to visit.     ROS Review of Systems  Constitutional: Negative for activity change, appetite change and fatigue.  HENT: Negative for congestion, sinus pressure and sore throat.   Eyes: Negative for visual disturbance.  Respiratory: Negative for cough, chest tightness, shortness of breath and wheezing.   Cardiovascular: Negative for chest pain and  palpitations.  Gastrointestinal: Negative for abdominal distention, abdominal pain and constipation.  Endocrine: Negative for polydipsia.  Genitourinary: Negative for dysuria and frequency.  Musculoskeletal: Negative for arthralgias and back pain.  Skin: Negative for rash.  Neurological: Negative for tremors, light-headedness and numbness.  Hematological: Does not bruise/bleed easily.  Psychiatric/Behavioral: Negative for agitation and behavioral problems.    Objective:  BP 113/72   Pulse 66   Temp 98 F (36.7 C) (Oral)   Ht 5' 1"  (1.549 m)   Wt 161 lb (73 kg)   SpO2 99%   BMI 30.42 kg/m   BP/Weight 12/01/2017 9/32/3557 09/21/2023  Systolic BP 427 062 376  Diastolic BP 72 73 62  Wt. (Lbs) 161 157.8 157  BMI 30.42 29.82 29.66      Physical Exam  Constitutional: She is oriented to person, place, and time. She appears well-developed and well-nourished.  Cardiovascular: Normal rate, normal heart sounds and intact distal pulses.  No murmur heard. Pulmonary/Chest: Effort normal and breath sounds normal. She has no wheezes. She has no rales. She exhibits no tenderness.  Abdominal: Soft. Bowel sounds are normal. She exhibits no distension and no mass. There is no tenderness.  Musculoskeletal: Normal range of motion.  Neurological: She is alert and oriented to person, place, and time.  Skin: Skin is warm and dry.  Psychiatric: She has a normal mood and affect.     CMP Latest Ref Rng & Units 04/14/2017 12/20/2016 08/12/2016  Glucose 65 - 99 mg/dL 117(H) 118(H) 115(H)  BUN 6 - 24 mg/dL 7 10 9  Creatinine 0.57 - 1.00 mg/dL 0.65 0.56(L) 0.49(L)  Sodium 134 - 144 mmol/L 140 139 139  Potassium 3.5 - 5.2 mmol/L 4.1 4.3 4.2  Chloride 96 - 106 mmol/L 104 103 103  CO2 20 - 29 mmol/L 20 21 26   Calcium 8.7 - 10.2 mg/dL 9.6 9.1 9.5  Total Protein 6.0 - 8.5 g/dL 6.9 - 7.3  Total Bilirubin 0.0 - 1.2 mg/dL 0.6 - 0.5  Alkaline Phos 39 - 117 IU/L 59 - 63  AST 0 - 40 IU/L 15 - 20  ALT 0 - 32  IU/L 19 - 24    Lipid Panel     Component Value Date/Time   CHOL 128 04/14/2017 1120   TRIG 141 04/14/2017 1120   HDL 40 04/14/2017 1120   CHOLHDL 3.2 04/14/2017 1120   CHOLHDL 5.4 (H) 08/12/2016 0932   VLDL 27 07/11/2010 2115   LDLCALC 60 04/14/2017 1120    Lab Results  Component Value Date   TSH 0.733 12/20/2016    Lab Results  Component Value Date   HGBA1C 6.6 12/01/2017    Assessment & Plan:   1. Hyperlipidemia, unspecified hyperlipidemia type Controlled Low-cholesterol diet - atorvastatin (LIPITOR) 20 MG tablet; Take 1 tablet (20 mg total) by mouth daily.  Dispense: 30 tablet; Refill: 6 - CMP14+EGFR - Lipid panel  2. Type 2 diabetes mellitus without complication, without long-term current use of insulin ([place]) Newly diagnosed with A1c of 6.6 Pharmacist called in for diabetic teaching - metFORMIN (GLUCOPHAGE) 500 MG tablet; Take 1 tablet (500 mg total) by mouth 2 (two) times daily with a meal.  Dispense: 60 tablet; Refill: 3 - Blood Glucose Monitoring Suppl (ACCU-CHEK AVIVA) device; Use as instructed daily.  Dispense: 1 each; Refill: 0 - glucose blood (ACCU-CHEK AVIVA) test strip; Daily before breakfast  Dispense: 30 each; Refill: 12 - Lancets Misc. (ACCU-CHEK SOFTCLIX LANCET DEV) KIT; 1 each by Does not apply route daily.  Dispense: 1 kit; Refill: 0 - POCT glycosylated hemoglobin (Hb A1C)  3. 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 Tamala Julian and patient has been made aware. - Ambulatory referral to Endocrinology   Meds ordered this encounter  Medications  . atorvastatin (LIPITOR) 20 MG tablet    Sig: Take 1 tablet (20 mg total) by mouth daily.    Dispense:  30 tablet    Refill:  6  . metFORMIN (GLUCOPHAGE) 500 MG tablet    Sig: Take 1 tablet (500 mg total) by mouth 2 (two) times daily with a meal.    Dispense:  60 tablet    Refill:  3    . Blood Glucose Monitoring Suppl (ACCU-CHEK AVIVA) device    Sig: Use as instructed daily.    Dispense:  1 each    Refill:  0  . glucose blood (ACCU-CHEK AVIVA) test strip    Sig: Daily before breakfast    Dispense:  30 each    Refill:  12  . Lancets Misc. (ACCU-CHEK SOFTCLIX LANCET DEV) KIT    Sig: 1 each by Does not apply route daily.    Dispense:  1 kit    Refill:  0    Follow-up: Return in about 1 month (around 01/01/2018) for coordination of care.   Charlott Rakes MD

## 2017-12-01 NOTE — Patient Instructions (Signed)
Diabetes mellitus y nutrición  Diabetes Mellitus and Nutrition  Si sufre de diabetes (diabetes mellitus), es muy importante tener hábitos alimenticios saludables debido a que sus niveles de azúcar en la sangre (glucosa) se ven afectados en gran medida por lo que come y bebe. [place] alimentos saludables en las cantidades adecuadas, aproximadamente a la misma hora todos los días, lo ayudará a:  · Controlar la glucemia.  · Disminuir el riesgo de sufrir una enfermedad cardíaca.  · Mejorar la presión arterial.  · Alcanzar o mantener un peso saludable.    Todas las personas que sufren de diabetes son diferentes y cada una tiene necesidades diferentes en cuanto a un plan de alimentación. El médico puede recomendarle que trabaje con un especialista en dietas y nutrición (nutricionista) para elaborar el mejor plan para usted. Su plan de alimentación puede variar según factores como:  · Las calorías que necesita.  · Los medicamentos que toma.  · Su peso.  · Sus niveles de glucemia, presión arterial y colesterol.  · Su nivel de actividad.  · Otras afecciones que tenga, como enfermedades cardíacas o renales.    ¿Cómo me afectan los carbohidratos?  Los carbohidratos afectan el nivel de glucemia más que cualquier otro tipo de alimento. La ingesta de carbohidratos naturalmente aumenta la cantidad glucosa en la sangre. El recuento de carbohidratos es un método destinado a llevar un registro de la cantidad de carbohidratos que se ingieren. El recuento de carbohidratos es importante para mantener la glucemia a un nivel saludable, en especial si utiliza insulina o toma determinados medicamentos por vía oral para la diabetes.  Es importante saber la cantidad de carbohidratos que se pueden ingerir en cada comida sin correr ningún riesgo. Esto es diferente en cada persona. El nutricionista puede ayudarlo a calcular la cantidad de carbohidratos que debe ingerir en cada comida y colación.   Los alimentos que contienen carbohidratos incluyen:  · Pan, cereal, arroz, pasta y galletas.  · Papas y maíz.  · Guisantes, frijoles y lentejas.  · Leche y yogur.  · Frutas y jugo.  · Postres, como pasteles, galletitas, helado y caramelos.    ¿Cómo me afecta el alcohol?  El alcohol puede provocar disminuciones súbitas de la glucemia (hipoglucemia), en especial si utiliza insulina o toma determinados medicamentos por vía oral para la diabetes. La hipoglucemia es una afección potencialmente mortal. Los síntomas de la hipoglucemia (somnolencia, mareos y confusión) son similares a los síntomas de haber consumido demasiado alcohol.  Si el médico afirma que el alcohol es seguro para usted, siga estas pautas:  · Limite el consumo de alcohol a no más de 1 medida por día si es mujer y no está embarazada, y a 2 medidas si es hombre. Una medida equivale a 12 oz (355 ml) de cerveza, 5 oz (148 ml) de vino o 1½ oz (44 ml) de bebidas de alta graduación alcohólica.  · No beba con el estómago vacío.  · Manténgase hidratado con agua, gaseosas dietéticas o té helado sin azúcar.  · Tenga en cuenta que las gaseosas comunes, los jugos y otros refrescos pueden contener mucha azúcar y se deben contar como carbohidratos.    Consejos para seguir este plan  Leer las etiquetas de los alimentos  · Comience por controlar el tamaño de la porción en la etiqueta. La cantidad de calorías, carbohidratos, grasas y otros nutrientes mencionados en la etiqueta se basan en una porción del alimento. Muchos alimentos contienen más de una porción por envase.  · Verifique la cantidad total de gramos (g)   de carbohidratos totales en una porción. Puede calcular la cantidad de porciones de carbohidratos al dividir el total de carbohidratos por 15. Por ejemplo, si un alimento posee un total de 30 g de carbohidratos, equivale a 2 porciones de carbohidratos.  · Verifique la cantidad de gramos (g) de grasas saturadas y grasas trans  en una porción. Escoja alimentos que no contengan grasa o que tengan un bajo contenido.  · Controle la cantidad de miligramos (mg) de sodio en una porción. La mayoría de las personas deben limitar la ingesta de sodio total a menos de 2300 mg por día.  · Siempre consulte la información nutricional de los alimentos etiquetados como “con bajo contenido de grasa” o “sin grasa”. Estos alimentos pueden ser más altos en azúcar agregada o en carbohidratos refinados y deben evitarse.  · Hable con el nutricionista para identificar sus objetivos diarios en cuanto a los nutrientes mencionados en la etiqueta.  De compras  · Evite comprar alimentos procesados, enlatados o prehechos. Estos alimentos tienden a tener mayor cantidad de grasa, sodio y azúcar agregada.  · Compre en la zona exterior de la tienda de comestibles. Esta incluye frutas y vegetales frescos, granos a granel, carnes frescas y productos lácteos frescos.  Cocción  · Utilice métodos de cocción a baja temperatura, como hornear, en lugar de métodos de cocción a alta temperatura, como freír en abundante aceite.  · Cocine con aceites saludables, como el aceite de oliva, canola o girasol.  · Evite cocinar con manteca, crema o carnes con alto contenido de grasa.  Planificación de las comidas  · Consuma las comidas y las colaciones de forma regular, preferentemente a la misma hora todos los días. Evite pasar largos períodos de tiempo sin comer.  · Consuma alimentos ricos en fibra, como frutas frescas, verduras, frijoles y cereales integrales. Consulte al nutricionista sobre cuántas porciones de carbohidratos puede consumir en cada comida.  · Consuma entre 4 y 6 onzas de proteínas magras por día, como carnes magras, pollo, pescado, huevos o tofu. 1 onza equivale a 1 onza de carne, pollo o pescado, 1 huevo, o 1/4 taza de tofu.  · Coma algunos alimentos por día que contengan grasas saludables, como aguacates, frutos secos, semillas y pescado.  Estilo de vida     · Controle su nivel de glucemia con regularidad.  · Haga ejercicio al menos 30 minutos, 5 días o más por semana, o como se lo haya indicado el médico.  · Tome los medicamentos como se lo haya indicado el médico.  · No consuma ningún producto que contenga nicotina o tabaco, como cigarrillos y cigarrillos electrónicos. Si necesita ayuda para dejar de fumar, consulte al médico.  · Trabaje con un asesor o instructor en diabetes para identificar estrategias para controlar el estrés y cualquier desafío emocional y social.  ¿Cuáles son algunas de las preguntas que puedo hacerle a mi médico?  · ¿Es necesario que me reúna con un instructor en diabetes?  · ¿Es necesario que me reúna con un nutricionista?  · ¿A qué número puedo llamar si tengo preguntas?  · ¿Cuáles son los mejores momentos para controlar la glucemia?  Dónde encontrar más información:  · Asociación Americana de la Diabetes (American Diabetes Association): diabetes.org/food-and-fitness/food  · Academia de Nutrición y Dietética (Academy of Nutrition and Dietetics): www.eatright.org/resources/health/diseases-and-conditions/diabetes  · Instituto Nacional de la Diabetes y las Enfermedades Digestivas y Renales (National Institute of Diabetes and Digestive and Kidney Diseases) (Institutos Nacionales de Salud, NIH): www.niddk.nih.gov/health-information/diabetes/overview/diet-eating-physical-activity  Resumen  · Un plan de alimentación saludable   lo ayudará a controlar la glucemia y mantener un estilo de vida saludable.  · Trabajar con un especialista en dietas y nutrición (nutricionista) puede ayudarlo a elaborar el mejor plan de alimentación para usted.  · Tenga en cuenta que los carbohidratos y el alcohol tienen efectos inmediatos en sus niveles de glucemia. Es importante contar los carbohidratos y consumir alcohol con prudencia.  Esta información no tiene como fin reemplazar el consejo del médico. Asegúrese de hacerle al médico cualquier pregunta que tenga.   Document Released: 10/15/2007 Document Revised: 10/28/2016 Document Reviewed: 10/28/2016  Elsevier Interactive Patient Education © 2018 Elsevier Inc.

## 2017-12-02 LAB — CMP14+EGFR
ALBUMIN: 4.4 g/dL (ref 3.5–5.5)
ALT: 20 IU/L (ref 0–32)
AST: 13 IU/L (ref 0–40)
Albumin/Globulin Ratio: 1.8 (ref 1.2–2.2)
Alkaline Phosphatase: 65 IU/L (ref 39–117)
BUN / CREAT RATIO: 17 (ref 9–23)
BUN: 10 mg/dL (ref 6–24)
Bilirubin Total: 0.4 mg/dL (ref 0.0–1.2)
CALCIUM: 9.2 mg/dL (ref 8.7–10.2)
CO2: 20 mmol/L (ref 20–29)
CREATININE: 0.59 mg/dL (ref 0.57–1.00)
Chloride: 103 mmol/L (ref 96–106)
GFR calc Af Amer: 126 mL/min/{1.73_m2} (ref >59)
GFR, EST NON AFRICAN AMERICAN: 109 mL/min/{1.73_m2} (ref >59)
Globulin, Total: 2.4 g/dL (ref 1.5–4.5)
Glucose: 136 mg/dL — ABNORMAL HIGH (ref 65–99)
Potassium: 4.4 mmol/L (ref 3.5–5.2)
Sodium: 137 mmol/L (ref 134–144)
Total Protein: 6.8 g/dL (ref 6.0–8.5)

## 2017-12-02 LAB — LIPID PANEL
CHOL/HDL RATIO: 3 ratio (ref 0.0–4.4)
Cholesterol, Total: 118 mg/dL (ref 100–199)
HDL: 40 mg/dL (ref >39)
LDL CALC: 53 mg/dL (ref 0–99)
TRIGLYCERIDES: 124 mg/dL (ref 0–149)
VLDL CHOLESTEROL CAL: 25 mg/dL (ref 5–40)

## 2017-12-03 ENCOUNTER — Telehealth: Payer: Self-pay

## 2017-12-03 NOTE — Telephone Encounter (Signed)
-----   Message from Ute sent at 12/03/2017 10:15 AM EDT -----   ----- Message ----- From: Charlott Rakes, MD Sent: 12/02/2017   8:13 AM To: Gomez Cleverly, CMA  Labs are normal

## 2017-12-03 NOTE — Telephone Encounter (Signed)
CMA call regarding lab results   Patient Verify DOB   Patient was aware and understood in her language

## 2018-01-19 ENCOUNTER — Ambulatory Visit: Payer: Medicaid Other | Attending: Family Medicine | Admitting: Family Medicine

## 2018-01-19 ENCOUNTER — Encounter: Payer: Self-pay | Admitting: Family Medicine

## 2018-01-19 VITALS — BP 112/72 | HR 70 | Temp 98.1°F | Ht 61.0 in | Wt 157.0 lb

## 2018-01-19 DIAGNOSIS — Z79899 Other long term (current) drug therapy: Secondary | ICD-10-CM | POA: Insufficient documentation

## 2018-01-19 DIAGNOSIS — D497 Neoplasm of unspecified behavior of endocrine glands and other parts of nervous system: Secondary | ICD-10-CM | POA: Diagnosis not present

## 2018-01-19 DIAGNOSIS — G629 Polyneuropathy, unspecified: Secondary | ICD-10-CM

## 2018-01-19 DIAGNOSIS — Z7984 Long term (current) use of oral hypoglycemic drugs: Secondary | ICD-10-CM | POA: Insufficient documentation

## 2018-01-19 DIAGNOSIS — E079 Disorder of thyroid, unspecified: Secondary | ICD-10-CM | POA: Diagnosis not present

## 2018-01-19 DIAGNOSIS — Z791 Long term (current) use of non-steroidal anti-inflammatories (NSAID): Secondary | ICD-10-CM | POA: Insufficient documentation

## 2018-01-19 DIAGNOSIS — E1142 Type 2 diabetes mellitus with diabetic polyneuropathy: Secondary | ICD-10-CM | POA: Diagnosis not present

## 2018-01-19 DIAGNOSIS — M7989 Other specified soft tissue disorders: Secondary | ICD-10-CM | POA: Insufficient documentation

## 2018-01-19 DIAGNOSIS — E785 Hyperlipidemia, unspecified: Secondary | ICD-10-CM | POA: Diagnosis not present

## 2018-01-19 DIAGNOSIS — E119 Type 2 diabetes mellitus without complications: Secondary | ICD-10-CM | POA: Diagnosis not present

## 2018-01-19 LAB — GLUCOSE, POCT (MANUAL RESULT ENTRY): POC Glucose: 175 mg/dl — AB (ref 70–99)

## 2018-01-19 NOTE — Patient Instructions (Signed)
Neuropata perifrica (Peripheral Neuropathy) La neuropata perifrica es un tipo de lesin nerviosa. Afecta a los nervios que transportan seales entre la mdula espinal y otras partes del cuerpo. Estos se denominan nervios perifricos. Con la neuropata perifrica, es posible que haya lesiones en un nervio o en un grupo de nervios. [place] lesiones en los nervios perifricos pueden ser [place]. En algunas personas se desconoce la causa de la neuropata perifrica. Algunas causas son:  Diabetes. Esta es la causa ms comn de la neuropata perifrica.  Lesin en un nervio.  Presin o tensin duraderas en un nervio.  [place] puede ser el alcoholismo.  Infecciones.  Enfermedades autoinmunitarias, como la esclerosis mltiple y el lupus eritematoso sistmico.  Enfermedades nerviosas hereditarias.  Algunos medicamentos, como los medicamentos para Science writer.  Sustancias txicas, como el plomo y [place].  Flujo deficiente de [employer].  Enfermedades renales.  Enfermedad tiroidea. [place]. Los sntomas que tenga dependern del tipo de nervio que se haya lesionado. Los sntomas ms comunes son:  Prdida de la sensibilidad (adormecimiento) en los pies y [place].  Hormigueo en los pies y las manos.  Dolor y ardor.  Piel muy sensible.  Debilidad.  Imposibilidad para mover partes del cuerpo (parlisis).  Fasciculaciones (temblores musculares).  Torpeza o coordinacin deficiente.  Prdida del equilibrio.  Dificultad para controlar la vejiga.  Sensacin de [employer].  Problemas sexuales. DIAGNSTICO La neuropata perifrica es un sntoma, no una enfermedad. Puede ser difcil encontrar la causa de la neuropata perifrica. Para averiguarlo, el mdico le har una historia clnica y un examen fsico. Tambin se realizar un examen neurolgico. Esto involucra controlar  aspectos que puedan verse afectados por el cerebro, la mdula espinal y los nervios (sistema nervioso). Por ejemplo, el mdico controlar sus reflejos, cmo se mueve y su grado de sensibilidad. Posiblemente le realicen otros tipos de pruebas, como las siguientes:  Anlisis de [place].  Una prueba del lquido de la mdula espinal.  Pruebas de diagnstico por imagen, como tomografas computarizadas (TC) o resonancias magnticas (RM).  Electromiograma (EMG). Esta prueba evala los nervios que controlan los msculos.  Pruebas de velocidad de conduccin nerviosa. Estas pruebas evalan la velocidad con la que los mensajes pasan a travs de los nervios.  Biopsia del nervio. Se extirpa un pedazo pequeo del nervio, que luego se examina con el microscopio. TRATAMIENTO  Con frecuencia, la neuropata perifrica se trata con medicamentos, entre los que se [employer] siguientes: ? Analgsicos. Se pueden sugerir medicamentos recetados o de [employer]. ? Medicamentos anticonvulsivos. Estos posiblemente se usen para Conservation officer, historic buildings. ? Antidepresivos. Estos tambin pueden ayudar a Best boy provocado por la neuropata. ? Lidocana. Este es un anestsico. Puede usar un parche o recibir una dosis. ? Mexiletina. Este medicamento se [place] normalmente para ayudar a Chief Technology Officer los ritmos cardacos irregulares.  Ciruga. Es posible que la ciruga sea necesaria para Public house manager la presin en un nervio o para destruir un nervio que est provocando dolor.  La fisioterapia ayuda al movimiento.  Dispositivos de asistencia que ayuden al [employer].  [place] solo medicamentos de venta libre o recetados, segn las indicaciones del mdico. Siga cuidadosamente las instrucciones de Environmental manager. No tome ningn otro medicamento sin obtener primero la aprobacin del mdico.  Si tiene diabetes, trabaje estrechamente con el mdico para mantener bajo control el nivel de azcar en  sangre.  Si siente adormecimiento en los pies, haga lo siguiente: ? Realice controles todos los das para detectar signos de lesin o infeccin. Observe seales de enrojecimiento, calor e hinchazn. ? Use calcetines acolchados y zapatos cmodos. Estos ayudan a [employer].  No realice actividades que ejerzan presin sobre el nervio lesionado.  No fume. Fumar evita que la sangre llegue a los nervios lesionados.  Evite o limite el consumo alcohol. Demasiado alcohol puede provocar una falta de vitaminas B. Estas vitaminas son necesarias para tener nervios saludables.  Desarrolle un buen sistema de apoyo. Hacerle frente a la neuropata perifrica puede ser estresante. Hable con un profesional de la salud mental o nase a un grupo de apoyo si le cuesta enfrentar la situacin.  Concurra a las consultas de control con su mdico segn las indicaciones.  SOLICITE ATENCIN MDICA SI:  Advierte signos o sntomas nuevos de la neuropata perifrica.  Le est costando lidiar con la neuropata perifrica desde el punto de vista emocional.  Tiene fiebre.  SOLICITE ATENCIN MDICA DE INMEDIATO SI:  Tiene una lesin o infeccin que no se cura.  Se siente muy mareado o comienza a vomitar.  Siente dolor en el pecho.  Tiene dificultad para respirar.  Esta informacin no tiene Marine scientist el consejo del mdico. Asegrese de hacerle al mdico cualquier pregunta que tenga. Document Released: 10/24/2008 Document Revised: 10/30/2015 Document Reviewed: 03/15/2013 Elsevier Interactive Patient Education  2017 [employer].

## 2018-01-19 NOTE — Progress Notes (Signed)
Subjective:  Patient ID: Debbie Lawson, female    DOB: 1970/09/20  Age: 47 y.o. 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 I had referred her to endocrine and she informs me she was seen by Dr. Tamala Julian of [facility] endocrinology with repeat thyroid ultrasound performed and scheduling for thyroid biopsy on 01/30/2018. With regards to her diabetes mellitus she is tolerating her metformin and denies hypoglycemia. fasting sugars have been in the 90-100 range.  She endorses some numbness in her legs and intermittent swelling which resolves when she elevates her feet.  Past Medical History:  Diagnosis Date  . Hyperlipidemia 04/15/2017    Past Surgical History:  Procedure Laterality Date  . NO PAST SURGERIES      No Known Allergies   Outpatient Medications Prior to Visit  Medication Sig Dispense Refill  . atorvastatin (LIPITOR) 20 MG tablet Take 1 tablet (20 mg total) by mouth daily. 30 tablet 6  . Blood Glucose Monitoring Suppl (ACCU-CHEK AVIVA) device Use as instructed daily. 1 each 0  . Cholecalciferol (VITAMIN D PO) Take 1 tablet by mouth daily.    Marland Kitchen glucose blood (ACCU-CHEK AVIVA) test strip Daily before breakfast 30 each 12  . ibuprofen (ADVIL,MOTRIN) 800 MG tablet Take 1 tablet (800 mg total) by mouth 3 (three) times daily. 60 tablet 0  . Lancets Misc. (ACCU-CHEK SOFTCLIX LANCET DEV) KIT 1 each by Does not apply route daily. 1 kit 0  . metFORMIN (GLUCOPHAGE) 500 MG tablet Take 1 tablet (500 mg total) by mouth 2 (two) times daily with a meal. 60 tablet 3  . clotrimazole (LOTRIMIN) 1 % cream Apply 1 application topically 2 (two) times daily. (Patient not taking: Reported on 01/19/2018) 45 g 1   No facility-administered medications prior to visit.     ROS Review of Systems    Constitutional: Negative for activity change, appetite change and fatigue.  HENT: Negative for congestion, sinus pressure and sore throat.   Eyes: Negative for visual disturbance.  Respiratory: Negative for cough, chest tightness, shortness of breath and wheezing.   Cardiovascular: Negative for chest pain and palpitations.  Gastrointestinal: Negative for abdominal distention, abdominal pain and constipation.  Endocrine: Negative for polydipsia.  Genitourinary: Negative for dysuria and frequency.  Musculoskeletal: Negative for arthralgias and back pain.  Skin: Negative for rash.  Neurological: Negative for tremors, light-headedness and numbness.  Hematological: Does not bruise/bleed easily.  Psychiatric/Behavioral: Negative for agitation and behavioral problems.    Objective:  BP 112/72   Pulse 70   Temp 98.1 F (36.7 C) (Oral)   Ht 5' 1"  (1.549 m)   Wt 157 lb (71.2 kg)   SpO2 99%   BMI 29.66 kg/m   BP/Weight 01/19/2018 12/01/2017 04/04/7828  Systolic BP 562 130 865  Diastolic BP 72 72 73  Wt. (Lbs) 157 161 157.8  BMI 29.66 30.42 29.82      Physical Exam  Constitutional: She is oriented to person, place, and time. She appears well-developed and well-nourished.  Cardiovascular: Normal rate, normal heart sounds and intact distal pulses.  No murmur heard. Pulmonary/Chest: Effort normal and breath sounds normal. She has no wheezes. She has no rales. She exhibits no tenderness.  Abdominal: Soft. Bowel sounds are normal. She exhibits no distension and no mass. There is no tenderness.  Musculoskeletal: Normal range of motion.  Neurological: She is alert and oriented to person, place, and time.  Skin: Skin is warm and dry.    Lab Results  Component Value Date   HGBA1C 6.6 12/01/2017    Assessment & Plan:   1. Type 2 diabetes mellitus without complication, without long-term current use of insulin (HCC) Controlled with A1c of 6.6 Continue metformin, diabetic diet - POCT  glucose (manual entry)  2. Thyroid neoplasm Scheduled for thyroid biopsy on 01/30/2018 Followed by [facility]  3. 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   Follow-up: Return in about 3 months (around 04/21/2018) for follow up of chronic medical conditions.   Charlott Rakes MD

## 2018-03-02 DIAGNOSIS — E042 Nontoxic multinodular goiter: Secondary | ICD-10-CM | POA: Diagnosis not present

## 2018-03-25 DIAGNOSIS — E042 Nontoxic multinodular goiter: Secondary | ICD-10-CM | POA: Diagnosis not present

## 2018-03-30 ENCOUNTER — Other Ambulatory Visit: Payer: Self-pay | Admitting: Family Medicine

## 2018-03-30 DIAGNOSIS — E119 Type 2 diabetes mellitus without complications: Secondary | ICD-10-CM

## 2018-04-01 DIAGNOSIS — E042 Nontoxic multinodular goiter: Secondary | ICD-10-CM | POA: Diagnosis not present

## 2018-04-01 DIAGNOSIS — H6121 Impacted cerumen, right ear: Secondary | ICD-10-CM | POA: Diagnosis not present

## 2018-04-22 ENCOUNTER — Ambulatory Visit: Payer: Medicaid Other | Attending: Family Medicine | Admitting: Family Medicine

## 2018-04-22 ENCOUNTER — Encounter: Payer: Self-pay | Admitting: Family Medicine

## 2018-04-22 VITALS — BP 115/74 | HR 74 | Temp 98.1°F | Ht 61.0 in | Wt 153.6 lb

## 2018-04-22 DIAGNOSIS — Z79899 Other long term (current) drug therapy: Secondary | ICD-10-CM | POA: Diagnosis not present

## 2018-04-22 DIAGNOSIS — E785 Hyperlipidemia, unspecified: Secondary | ICD-10-CM | POA: Diagnosis not present

## 2018-04-22 DIAGNOSIS — Z23 Encounter for immunization: Secondary | ICD-10-CM

## 2018-04-22 DIAGNOSIS — Z7984 Long term (current) use of oral hypoglycemic drugs: Secondary | ICD-10-CM | POA: Insufficient documentation

## 2018-04-22 DIAGNOSIS — D497 Neoplasm of unspecified behavior of endocrine glands and other parts of nervous system: Secondary | ICD-10-CM

## 2018-04-22 DIAGNOSIS — C73 Malignant neoplasm of thyroid gland: Secondary | ICD-10-CM | POA: Insufficient documentation

## 2018-04-22 DIAGNOSIS — E119 Type 2 diabetes mellitus without complications: Secondary | ICD-10-CM | POA: Diagnosis not present

## 2018-04-22 LAB — POCT GLYCOSYLATED HEMOGLOBIN (HGB A1C): HbA1c, POC (controlled diabetic range): 5.9 % (ref 0.0–7.0)

## 2018-04-22 LAB — GLUCOSE, POCT (MANUAL RESULT ENTRY): POC GLUCOSE: 111 mg/dL — AB (ref 70–99)

## 2018-04-22 MED ORDER — METFORMIN HCL 500 MG PO TABS: ORAL_TABLET | ORAL | 6 refills | Status: DC

## 2018-04-22 MED ORDER — ATORVASTATIN CALCIUM 20 MG PO TABS: 20.0000 mg | ORAL_TABLET | Freq: Every day | ORAL | 6 refills | Status: DC

## 2018-04-23 LAB — MICROALBUMIN / CREATININE URINE RATIO
CREATININE, UR: 178.9 mg/dL
MICROALB/CREAT RATIO: 4.6 mg/g{creat} (ref 0.0–30.0)
MICROALBUM., U, RANDOM: 8.2 ug/mL

## 2018-04-23 NOTE — Progress Notes (Signed)
Subjective:  Patient ID: Debbie Lawson, female    DOB: [birth year]  Age: 47 y.o. 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 Seen by [facility] ENT on 04/01/18 and surgery recommended however notes states the patient was undecided and wanted to discuss with family first. From my discussion with Debbie Lawson she is of the opinion she had the option to either undergo surgery or watchful waiting.  With regards to her Diabetes she denies hypoglycemia,numbness in extremities or visual concerns and is compliant with her medications and her statin. Past Medical History:  Diagnosis Date  . Hyperlipidemia 04/15/2017    Past Surgical History:  Procedure Laterality Date  . NO PAST SURGERIES      No Known Allergies   Outpatient Medications Prior to Visit  Medication Sig Dispense Refill  . Blood Glucose Monitoring Suppl (ACCU-CHEK AVIVA) device Use as instructed daily. 1 each 0  . Cholecalciferol (VITAMIN D PO) Take 1 tablet by mouth daily.    Marland Kitchen glucose blood (ACCU-CHEK AVIVA) test strip Daily before breakfast 30 each 12  . ibuprofen (ADVIL,MOTRIN) 800 MG tablet Take 1 tablet (800 mg total) by mouth 3 (three) times daily. 60 tablet 0  . Lancets Misc. (ACCU-CHEK SOFTCLIX LANCET DEV) KIT 1 each by Does not apply route daily. 1 kit 0  . atorvastatin (LIPITOR) 20 MG tablet Take 1 tablet (20 mg total) by mouth daily. 30 tablet 6  . metFORMIN (GLUCOPHAGE) 500 MG tablet TAKE 1 TABLET BY MOUTH TWICE DAILY WITH A MEAL 60 tablet 2  . clotrimazole (LOTRIMIN) 1 % cream Apply 1 application topically 2 (two) times daily. (Patient not taking: Reported on 01/19/2018) 45 g 1   No  facility-administered medications prior to visit.     ROS Review of Systems  Constitutional: Negative for activity change, appetite change and fatigue.  HENT: Negative for congestion, sinus pressure and sore throat.   Eyes: Negative for visual disturbance.  Respiratory: Negative for cough, chest tightness, shortness of breath and wheezing.   Cardiovascular: Negative for chest pain and palpitations.  Gastrointestinal: Negative for abdominal distention, abdominal pain and constipation.  Endocrine: Negative for polydipsia.  Genitourinary: Negative for dysuria and frequency.  Musculoskeletal: Negative for arthralgias and back pain.  Skin: Negative for rash.  Neurological: Negative for tremors, light-headedness and numbness.  Hematological: Does not bruise/bleed easily.  Psychiatric/Behavioral: Negative for agitation and behavioral problems.    Objective:  BP 115/74   Pulse 74   Temp 98.1 F (36.7 C) (Oral)   Ht 5' 1"  (1.549 m)   Wt 153 lb 9.6 oz (69.7 kg)   SpO2 100%   BMI 29.02 kg/m   BP/Weight 04/22/2018 01/19/2018 04/04/7828  Systolic BP 562 130 865  Diastolic BP 74 72 72  Wt. (Lbs) 153.6 157 161  BMI 29.02 29.66 30.42      Physical Exam  Constitutional: She is oriented to person, place, and time. She appears well-developed and well-nourished.  Cardiovascular: Normal rate, normal heart sounds and intact distal pulses.  No murmur heard. Pulmonary/Chest: Effort normal and breath sounds normal. She has no wheezes. She has no rales. She exhibits no tenderness.  Abdominal: Soft. Bowel sounds are normal. She exhibits no distension and  no mass. There is no tenderness.  Musculoskeletal: Normal range of motion.  Neurological: She is alert and oriented to person, place, and time.  Skin: Skin is warm and dry.  Psychiatric: She has a normal mood and affect.   CMP Latest Ref Rng & Units 12/01/2017 04/14/2017 12/20/2016  Glucose 65 - 99 mg/dL 136(H) 117(H) 118(H)  BUN 6 - 24 mg/dL 10 7  10   Creatinine 0.57 - 1.00 mg/dL 0.59 0.65 0.56(L)  Sodium 134 - 144 mmol/L 137 140 139  Potassium 3.5 - 5.2 mmol/L 4.4 4.1 4.3  Chloride 96 - 106 mmol/L 103 104 103  CO2 20 - 29 mmol/L 20 20 21   Calcium 8.7 - 10.2 mg/dL 9.2 9.6 9.1  Total Protein 6.0 - 8.5 g/dL 6.8 6.9 -  Total Bilirubin 0.0 - 1.2 mg/dL 0.4 0.6 -  Alkaline Phos 39 - 117 IU/L 65 59 -  AST 0 - 40 IU/L 13 15 -  ALT 0 - 32 IU/L 20 19 -    Lipid Panel     Component Value Date/Time   CHOL 118 12/01/2017 1030   TRIG 124 12/01/2017 1030   HDL 40 12/01/2017 1030   CHOLHDL 3.0 12/01/2017 1030   CHOLHDL 5.4 (H) 08/12/2016 0932   VLDL 27 07/11/2010 2115   LDLCALC 53 12/01/2017 1030    Lab Results  Component Value Date   HGBA1C 5.9 04/22/2018     Assessment & Plan:   1. Type 2 diabetes mellitus without complication, without long-term current use of insulin (HCC) Controlled with A1c of 5.9 Continue current management - POCT glucose (manual entry) - POCT glycosylated hemoglobin (Hb A1C) - Microalbumin/Creatinine Ratio, Urine - metFORMIN (GLUCOPHAGE) 500 MG tablet; TAKE 1 TABLET BY MOUTH TWICE DAILY WITH A MEAL  Dispense: 60 tablet; Refill: 6  2. Hyperlipidemia, unspecified hyperlipidemia type Controlled Low cholesteroldiet - atorvastatin (LIPITOR) 20 MG tablet; Take 1 tablet (20 mg total) by mouth daily.  Dispense: 30 tablet; Refill: 6  3. Thyroid neoplasm Seen by ENT and patient undecided about surgery as per notes. Language barrier could be a major hindrance to decision making. I have explained to her the concern that she has cancer and she will be going in for an appointment with a family member who speaks English and is in a better condition to comprehend  4. Need for immunization against influenza - Flu Vaccine QUAD 36+ mos IM   Meds ordered this encounter  Medications  . metFORMIN (GLUCOPHAGE) 500 MG tablet    Sig: TAKE 1 TABLET BY MOUTH TWICE DAILY WITH A MEAL    Dispense:  60 tablet    Refill:   6    Please consider 90 day supplies to promote better adherence  . atorvastatin (LIPITOR) 20 MG tablet    Sig: Take 1 tablet (20 mg total) by mouth daily.    Dispense:  30 tablet    Refill:  6    Follow-up: Return in about 6 months (around 10/22/2018) for follow up on chronic medical conditions.   Charlott Rakes MD

## 2018-07-03 ENCOUNTER — Other Ambulatory Visit: Payer: Self-pay | Admitting: Family Medicine

## 2018-07-03 DIAGNOSIS — E119 Type 2 diabetes mellitus without complications: Secondary | ICD-10-CM

## 2018-09-21 DIAGNOSIS — E042 Nontoxic multinodular goiter: Secondary | ICD-10-CM | POA: Diagnosis not present

## 2018-12-17 DIAGNOSIS — E042 Nontoxic multinodular goiter: Secondary | ICD-10-CM | POA: Diagnosis not present

## 2019-01-04 ENCOUNTER — Other Ambulatory Visit: Payer: Self-pay | Admitting: Pharmacist

## 2019-01-04 DIAGNOSIS — E785 Hyperlipidemia, unspecified: Secondary | ICD-10-CM

## 2019-01-04 MED ORDER — ATORVASTATIN CALCIUM 20 MG PO TABS: 20.0000 mg | ORAL_TABLET | Freq: Every day | ORAL | 0 refills | Status: DC

## 2019-05-12 ENCOUNTER — Other Ambulatory Visit: Payer: Self-pay

## 2019-05-12 ENCOUNTER — Ambulatory Visit: Payer: Medicaid Other | Attending: Family Medicine | Admitting: Family Medicine

## 2019-05-12 ENCOUNTER — Encounter: Payer: Self-pay | Admitting: Family Medicine

## 2019-05-12 VITALS — BP 130/82 | HR 96 | Temp 98.5°F | Ht 61.0 in | Wt 151.6 lb

## 2019-05-12 DIAGNOSIS — E785 Hyperlipidemia, unspecified: Secondary | ICD-10-CM | POA: Diagnosis not present

## 2019-05-12 DIAGNOSIS — B351 Tinea unguium: Secondary | ICD-10-CM | POA: Diagnosis not present

## 2019-05-12 DIAGNOSIS — E119 Type 2 diabetes mellitus without complications: Secondary | ICD-10-CM

## 2019-05-12 DIAGNOSIS — E042 Nontoxic multinodular goiter: Secondary | ICD-10-CM | POA: Diagnosis not present

## 2019-05-12 DIAGNOSIS — Z23 Encounter for immunization: Secondary | ICD-10-CM | POA: Diagnosis not present

## 2019-05-12 LAB — POCT GLYCOSYLATED HEMOGLOBIN (HGB A1C): HbA1c, POC (controlled diabetic range): 6.6 % (ref 0.0–7.0)

## 2019-05-12 LAB — GLUCOSE, POCT (MANUAL RESULT ENTRY): POC Glucose: 91 mg/dl (ref 70–99)

## 2019-05-12 MED ORDER — TERBINAFINE HCL 250 MG PO TABS: 250.0000 mg | ORAL_TABLET | Freq: Every day | ORAL | 2 refills | Status: DC

## 2019-05-12 MED ORDER — ATORVASTATIN CALCIUM 20 MG PO TABS: 20.0000 mg | ORAL_TABLET | Freq: Every day | ORAL | 6 refills | Status: DC

## 2019-05-12 MED ORDER — METFORMIN HCL 500 MG PO TABS: ORAL_TABLET | ORAL | 6 refills | Status: DC

## 2019-05-12 NOTE — Patient Instructions (Signed)
Fungal Nail Infection A fungal nail infection is a common infection of the toenails or fingernails. This condition affects toenails more often than fingernails. It often affects the great, or big, toes. More than one nail may be infected. The condition can be passed from person to person (is contagious). What are the causes? This condition is caused by a fungus. Several types of fungi can cause the infection. These fungi are common in moist and warm areas. If your hands or feet come into contact with the fungus, it may get into a crack in your fingernail or toenail and cause the infection. What increases the risk? The following factors may make you more likely to develop this condition:  Being female.  Being of older age.  Living with someone who has the fungus.  Walking barefoot in areas where the fungus thrives, such as showers or locker rooms.  Wearing shoes and socks that cause your feet to sweat.  Having a nail injury or a recent nail surgery.  Having certain medical conditions, such as: ? Athlete's foot. ? Diabetes. ? Psoriasis. ? Poor circulation. ? A weak body defense system (immune system). What are the signs or symptoms? Symptoms of this condition include:  A pale spot on the nail.  Thickening of the nail.  A nail that becomes yellow or brown.  A brittle or ragged nail edge.  A crumbling nail.  A nail that has lifted away from the nail bed. How is this diagnosed? This condition is diagnosed with a physical exam. Your health care provider may take a scraping or clipping from your nail to test for the fungus. How is this treated? Treatment is not needed for mild infections. If you have significant nail changes, treatment may include:  Antifungal medicines taken by mouth (orally). You may need to take the medicine for several weeks or several months, and you may not see the results for a long time. These medicines can cause side effects. Ask your health care provider  what problems to watch for.  Antifungal nail polish or nail cream. These may be used along with oral antifungal medicines.  Laser treatment of the nail.  Surgery to remove the nail. This may be needed for the most severe infections. It can take a long time, usually up to a year, for the infection to go away. The infection may also come back. Follow these instructions at home: Medicines  Take or apply over-the-counter and prescription medicines only as told by your health care provider.  Ask your health care provider about using over-the-counter mentholated ointment on your nails. Nail care  Trim your nails often.  Wash and dry your hands and feet every day.  Keep your feet dry: ? Wear absorbent socks, and change your socks frequently. ? Wear shoes that allow air to circulate, such as sandals or canvas tennis shoes. Throw out old shoes.  Do not use artificial nails.  If you go to a nail salon, make sure you choose one that uses clean instruments.  Use antifungal foot powder on your feet and in your shoes. General instructions  Do not share personal items, such as towels or nail clippers.  Do not walk barefoot in shower rooms or locker rooms.  Wear rubber gloves if you are working with your hands in wet areas.  Keep all follow-up visits as told by your health care provider. This is important. Contact a health care provider if: Your infection is not getting better or it is getting worse   after several months. Summary  A fungal nail infection is a common infection of the toenails or fingernails.  Treatment is not needed for mild infections. If you have significant nail changes, treatment may include taking medicine orally and applying medicine to your nails.  It can take a long time, usually up to a year, for the infection to go away. The infection may also come back.  Take or apply over-the-counter and prescription medicines only as told by your health care provider.   Follow instructions for taking care of your nails to help prevent infection from coming back or spreading. This information is not intended to replace advice given to you by your health care provider. Make sure you discuss any questions you have with your health care provider. Document Released: 07/05/2000 Document Revised: 10/29/2018 Document Reviewed: 12/12/2017 Elsevier Patient Education  2020 Elsevier Inc.  

## 2019-05-12 NOTE — Progress Notes (Signed)
Subjective:  Patient ID: Debbie Lawson, female    DOB: 05-26-1971  Age: 48 y.o. 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 Last seen by ENT at [facility] on 11/2018 due to multiple thyroid nodules and FNA biopsy with suspicion for malignancy at a 50% chance and surgery planned however the patient opted to hold surgery until the pandemic is over.  She has been compliant with metformin and denies hypoglycemic symptoms, neuropathy or visual concerns. Compliant with her statin and denies myalgias or other adverse effects. She has had to clip the lateral aspect of her right big toe on different occasions due to ingrown toenail which is not painful at this time. She denies chest pain, dyspnea and has no additional concerns today.  Past Medical History:  Diagnosis Date  . Hyperlipidemia 04/15/2017    Past Surgical History:  Procedure Laterality Date  . NO PAST SURGERIES      Family History  Problem Relation Age of Onset  . Diabetes Mother     No Known Allergies  Outpatient Medications Prior to Visit  Medication Sig Dispense Refill  . ACCU-CHEK SOFTCLIX LANCETS lancets USE AS DIRECTED DAILY 100 each 2  . Blood Glucose Monitoring Suppl (ACCU-CHEK AVIVA) device Use as instructed daily. 1 each 0  . glucose blood (ACCU-CHEK AVIVA) test strip Daily before breakfast 30 each 12  . ibuprofen (ADVIL,MOTRIN) 800 MG tablet Take 1 tablet (800 mg total) by mouth 3 (three) times daily. 60 tablet 0  . atorvastatin (LIPITOR) 20 MG tablet Take 1 tablet (20 mg total) by mouth daily. Must have appt for more refills. 30 tablet 0  . metFORMIN (GLUCOPHAGE) 500 MG tablet TAKE 1 TABLET BY MOUTH TWICE DAILY WITH A MEAL 60 tablet 6  . Cholecalciferol (VITAMIN D PO) Take 1 tablet by mouth daily.     . clotrimazole (LOTRIMIN) 1 % cream Apply 1 application topically 2 (two) times daily. (Patient not taking: Reported on 01/19/2018) 45 g 1   No facility-administered medications prior to visit.      ROS Review of Systems  Constitutional: Negative for activity change, appetite change and fatigue.  HENT: Negative for congestion, sinus pressure and sore throat.   Eyes: Negative for visual disturbance.  Respiratory: Negative for cough, chest tightness, shortness of breath and wheezing.   Cardiovascular: Negative for chest pain and palpitations.  Gastrointestinal: Negative for abdominal distention, abdominal pain and constipation.  Endocrine: Negative for polydipsia.  Genitourinary: Negative for dysuria and frequency.  Musculoskeletal: Negative for arthralgias and back pain.  Skin: Negative for rash.  Neurological: Negative for tremors, light-headedness and numbness.  Hematological: Does not bruise/bleed easily.  Psychiatric/Behavioral: Negative for agitation and behavioral problems.    Objective:  BP 130/82   Pulse 96   Temp 98.5 F (36.9 C) (Oral)   Ht _0  (1.549 m)   Wt 151 lb 9.6 oz (68.8 kg)   SpO2 97%   BMI 28.64 kg/m   BP/Weight 05/12/2019 81/0/1751 0/08/5850  Systolic BP 778 242 353  Diastolic BP 82 74 72  Wt. (Lbs) 151.6 153.6 157  BMI 28.64 29.02 29.66      Physical Exam Constitutional:      Appearance: She is well-developed.  Neck:     Vascular: No JVD.  Cardiovascular:     Rate and  Rhythm: Normal rate.     Heart sounds: Normal heart sounds. No murmur.  Pulmonary:     Effort: Pulmonary effort is normal.     Breath sounds: Normal breath sounds. No wheezing or rales.  Chest:     Chest wall: No tenderness.  Abdominal:     General: Bowel sounds are normal. There is no distension.     Palpations: Abdomen is soft. There is no mass.     Tenderness: There is no abdominal tenderness.  Musculoskeletal: Normal range of motion.     Right lower leg: No edema.      Left lower leg: No edema.  Skin:    Comments: Right big toenail with onychomycosis in upper lateral aspect  Neurological:     Mental Status: She is alert and oriented to person, place, and time.  Psychiatric:        Mood and Affect: Mood normal.     CMP Latest Ref Rng & Units 12/01/2017 04/14/2017 12/20/2016  Glucose 65 - 99 mg/dL 136(H) 117(H) 118(H)  BUN 6 - 24 mg/dL _0 Creatinine 0.57 - 1.00 mg/dL 0.59 0.65 0.56(L)  Sodium 134 - 144 mmol/L 137 140 139  Potassium 3.5 - 5.2 mmol/L 4.4 4.1 4.3  Chloride 96 - 106 mmol/L 103 104 103  CO2 20 - 29 mmol/L _1 Calcium 8.7 - 10.2 mg/dL 9.2 9.6 9.1  Total Protein 6.0 - 8.5 g/dL 6.8 6.9 -  Total Bilirubin 0.0 - 1.2 mg/dL 0.4 0.6 -  Alkaline Phos 39 - 117 IU/L 65 59 -  AST 0 - 40 IU/L 13 15 -  ALT 0 - 32 IU/L 20 19 -    Lipid Panel     Component Value Date/Time   CHOL 118 12/01/2017 1030   TRIG 124 12/01/2017 1030   HDL 40 12/01/2017 1030   CHOLHDL 3.0 12/01/2017 1030   CHOLHDL 5.4 (H) 08/12/2016 0932   VLDL 27 07/11/2010 2115   LDLCALC 53 12/01/2017 1030    CBC    Component Value Date/Time   WBC 6.2 08/12/2016 0932   RBC 4.87 08/12/2016 0932   HGB 15.1 08/12/2016 0932   HCT 44.6 08/12/2016 0932   PLT 331 08/12/2016 0932   MCV 91.6 08/12/2016 0932   MCH 31.0 08/12/2016 0932   MCHC 33.9 08/12/2016 0932   RDW 12.9 08/12/2016 0932   LYMPHSABS 2,170 08/12/2016 0932   MONOABS 558 08/12/2016 0932   EOSABS 124 08/12/2016 0932   BASOSABS 0 08/12/2016 0932    Lab Results  Component Value Date   HGBA1C 6.6 05/12/2019    Assessment & Plan:   1. Type 2 diabetes mellitus without complication, without long-term current use of insulin (HCC) Controlled with A1c of 6.6 Continue current regimen Counseled on Diabetic diet, my plate method, 149 minutes of moderate intensity exercise/week Blood sugar logs with fasting goals of 80-120 mg/dl, random of less than 180 and in the event of sugars less than 60 mg/dl or  greater than 400 mg/dl encouraged to notify the clinic. Advised on the need for annual eye exams, annual foot exams, Pneumonia vaccine. - POCT glucose (manual entry) - POCT glycosylated hemoglobin (Hb A1C) - Microalbumin/Creatinine Ratio, Urine; Future - metFORMIN (GLUCOPHAGE) 500 MG tablet; TAKE 1 TABLET BY MOUTH TWICE DAILY WITH A MEAL  Dispense: 60 tablet; Refill: 6 - CMP14+EGFR; Future - Lipid panel; Future  2. Hyperlipidemia, unspecified hyperlipidemia type Controlled She is due for lipid panel Low cholesterol diet -  atorvastatin (LIPITOR) 20 MG tablet; Take 1 tablet (20 mg total) by mouth daily.  Dispense: 30 tablet; Refill: 6  3. Onychomycosis - terbinafine (LAMISIL) 250 MG tablet; Take 1 tablet (250 mg total) by mouth daily.  Dispense: 30 tablet; Refill: 2 - Ambulatory referral to Podiatry  4. Need for influenza vaccination - Flu Vaccine QUAD 36+ mos IM  5. Multinodular thyroid In need of thyroid surgery which has been placed on hold by patient due to ongoing pandemic Followed by ENT    Meds ordered this encounter  Medications  . metFORMIN (GLUCOPHAGE) 500 MG tablet    Sig: TAKE 1 TABLET BY MOUTH TWICE DAILY WITH A MEAL    Dispense:  60 tablet    Refill:  6    Please consider 90 day supplies to promote better adherence  . atorvastatin (LIPITOR) 20 MG tablet    Sig: Take 1 tablet (20 mg total) by mouth daily.    Dispense:  30 tablet    Refill:  6  . terbinafine (LAMISIL) 250 MG tablet    Sig: Take 1 tablet (250 mg total) by mouth daily.    Dispense:  30 tablet    Refill:  2    Follow-up: Return in about 6 months (around 11/10/2019) for Chronic medical conditions.       Charlott Rakes, MD, FAAFP. [facility] and [place], [place]   05/12/2019, 4:33 PM

## 2019-05-13 ENCOUNTER — Ambulatory Visit: Payer: Medicaid Other | Attending: Family Medicine

## 2019-05-13 DIAGNOSIS — E119 Type 2 diabetes mellitus without complications: Secondary | ICD-10-CM

## 2019-05-14 ENCOUNTER — Telehealth: Payer: Self-pay

## 2019-05-14 LAB — CMP14+EGFR
ALT: 21 IU/L (ref 0–32)
AST: 23 IU/L (ref 0–40)
Albumin/Globulin Ratio: 1.6 (ref 1.2–2.2)
Albumin: 4.2 g/dL (ref 3.8–4.8)
Alkaline Phosphatase: 74 IU/L (ref 39–117)
BUN/Creatinine Ratio: 7 — ABNORMAL LOW (ref 9–23)
BUN: 5 mg/dL — ABNORMAL LOW (ref 6–24)
Bilirubin Total: 0.5 mg/dL (ref 0.0–1.2)
CO2: 25 mmol/L (ref 20–29)
Calcium: 9.1 mg/dL (ref 8.7–10.2)
Chloride: 101 mmol/L (ref 96–106)
Creatinine, Ser: 0.69 mg/dL (ref 0.57–1.00)
GFR calc Af Amer: 119 mL/min/{1.73_m2} (ref >59)
GFR calc non Af Amer: 103 mL/min/{1.73_m2} (ref >59)
Globulin, Total: 2.6 g/dL (ref 1.5–4.5)
Glucose: 113 mg/dL — ABNORMAL HIGH (ref 65–99)
Potassium: 4 mmol/L (ref 3.5–5.2)
Sodium: 140 mmol/L (ref 134–144)
Total Protein: 6.8 g/dL (ref 6.0–8.5)

## 2019-05-14 LAB — LIPID PANEL
Chol/HDL Ratio: 4.2 ratio (ref 0.0–4.4)
Cholesterol, Total: 101 mg/dL (ref 100–199)
HDL: 24 mg/dL — ABNORMAL LOW (ref >39)
LDL Chol Calc (NIH): 50 mg/dL (ref 0–99)
Triglycerides: 154 mg/dL — ABNORMAL HIGH (ref 0–149)
VLDL Cholesterol Cal: 27 mg/dL (ref 5–40)

## 2019-05-14 LAB — MICROALBUMIN / CREATININE URINE RATIO
Creatinine, Urine: 138.9 mg/dL
Microalb/Creat Ratio: 5 mg/g creat (ref 0–29)
Microalbumin, Urine: 6.5 ug/mL

## 2019-05-14 NOTE — Telephone Encounter (Signed)
Patient name and DOB has been verified Patient was informed of lab results. Patient had no questions.  

## 2019-05-14 NOTE — Telephone Encounter (Signed)
-----   Message from Charlott Rakes, MD sent at 05/14/2019  1:41 PM EDT ----- Labs are stable, cholesterol is normal.

## 2019-05-20 ENCOUNTER — Telehealth: Payer: Self-pay

## 2019-05-20 NOTE — Telephone Encounter (Signed)
-----   Message from Charlott Rakes, MD sent at 05/14/2019  1:41 PM EDT ----- Labs are stable, cholesterol is normal.

## 2019-05-20 NOTE — Telephone Encounter (Signed)
Patient name and DOB has been verified Patient was informed of lab results. Patient had no questions.  

## 2019-09-22 ENCOUNTER — Ambulatory Visit (HOSPITAL_COMMUNITY)
Admission: RE | Admit: 2019-09-22 | Discharge: 2019-09-22 | Disposition: A | Discharge status: Home / Self Care | Payer: Medicaid Other | Source: Ambulatory Visit | Attending: Physician Assistant | Admitting: Physician Assistant

## 2019-09-22 ENCOUNTER — Ambulatory Visit (HOSPITAL_BASED_OUTPATIENT_CLINIC_OR_DEPARTMENT_OTHER): Payer: Medicaid Other | Admitting: Physician Assistant

## 2019-09-22 ENCOUNTER — Other Ambulatory Visit: Payer: Self-pay

## 2019-09-22 VITALS — BP 130/79 | HR 86 | Temp 98.0°F | Resp 18 | Ht 61.0 in | Wt 154.0 lb

## 2019-09-22 DIAGNOSIS — E119 Type 2 diabetes mellitus without complications: Secondary | ICD-10-CM

## 2019-09-22 DIAGNOSIS — G8929 Other chronic pain: Secondary | ICD-10-CM

## 2019-09-22 DIAGNOSIS — M545 Low back pain, unspecified: Secondary | ICD-10-CM

## 2019-09-22 DIAGNOSIS — Z789 Other specified health status: Secondary | ICD-10-CM

## 2019-09-22 DIAGNOSIS — R319 Hematuria, unspecified: Secondary | ICD-10-CM | POA: Diagnosis not present

## 2019-09-22 DIAGNOSIS — R109 Unspecified abdominal pain: Secondary | ICD-10-CM | POA: Diagnosis not present

## 2019-09-22 LAB — POCT URINALYSIS DIP (CLINITEK)
Bilirubin, UA: NEGATIVE
Blood, UA: NEGATIVE
Glucose, UA: 100 mg/dL — AB
Ketones, POC UA: NEGATIVE mg/dL
Leukocytes, UA: NEGATIVE
Nitrite, UA: NEGATIVE
POC PROTEIN,UA: NEGATIVE
Spec Grav, UA: 1.015 (ref 1.010–1.025)
Urobilinogen, UA: 0.2 E.U./dL
pH, UA: 7 (ref 5.0–8.0)

## 2019-09-22 LAB — GLUCOSE, POCT (MANUAL RESULT ENTRY): POC Glucose: 180 mg/dl — AB (ref 70–99)

## 2019-09-22 LAB — POCT URINE PREGNANCY: Preg Test, Ur: NEGATIVE

## 2019-09-22 MED ORDER — IBUPROFEN 800 MG PO TABS: 800.0000 mg | ORAL_TABLET | Freq: Three times a day (TID) | ORAL | 0 refills | Status: DC

## 2019-09-22 NOTE — Progress Notes (Signed)
Patient ID: Debbie Lawson, female   DOB: 04-Nov-1970, 49 y.o.   MRN: [id number]       Sibyl-Flores Seacat, is a 49 y.o. female  [id number]  [id number]  DOB - 1971/01/24  Subjective:  Chief Complaint and HPI: Debbie Lawson is a 49 y.o. female here today feeling as though she is having pain in her R kidney.  She says it occurs on and off.  It is sharp and sudden then aches for several hours.  It sometimes bothers her for a week then goes away for a while.  NKI.  No radiating pain.  No abdominal pain.  She hasn't taken any meds for it.  She is concerned about her kidneys.  LMP 1 month ago and normal.    Not checking blood sugars at home.  Denies s/sx hyper/hypoglycemia.     ROS:   Constitutional:  No f/c, No night sweats, No unexplained weight loss. EENT:  No vision changes, No blurry vision, No hearing changes. No mouth, throat, or ear problems.  Respiratory: No cough, No SOB Cardiac: No CP, no palpitations GI:  No abd pain, No N/V/D. GU: No Urinary s/sx Musculoskeletal: R flank pain Neuro: No headache, no dizziness, no motor weakness.  Skin: No rash Endocrine:  No polydipsia. No polyuria.  Psych: Denies SI/HI  No problems updated.  ALLERGIES: No Known Allergies  PAST MEDICAL HISTORY: Past Medical History:  Diagnosis Date  . Hyperlipidemia 04/15/2017    MEDICATIONS AT HOME: Prior to Admission medications   Medication Sig Start Date End Date Taking? Authorizing Provider  ACCU-CHEK SOFTCLIX LANCETS lancets USE AS DIRECTED DAILY 07/03/18  Yes Charlott Rakes, MD  atorvastatin (LIPITOR) 20 MG tablet Take 1 tablet (20 mg total) by mouth daily. 05/12/19  Yes Charlott Rakes, MD  Blood Glucose Monitoring Suppl (ACCU-CHEK AVIVA) device Use as instructed daily. 12/01/17  Yes Charlott Rakes, MD  Cholecalciferol (VITAMIN D PO) Take 1 tablet by mouth daily.   Yes [provider]  glucose blood (ACCU-CHEK AVIVA) test strip Daily before breakfast  12/01/17  Yes Newlin, Enobong, MD  metFORMIN (GLUCOPHAGE) 500 MG tablet TAKE 1 TABLET BY MOUTH TWICE DAILY WITH A MEAL 05/12/19  Yes Newlin, Enobong, MD  terbinafine (LAMISIL) 250 MG tablet Take 1 tablet (250 mg total) by mouth daily. 05/12/19  Yes Charlott Rakes, MD  clotrimazole (LOTRIMIN) 1 % cream Apply 1 application topically 2 (two) times daily. Patient not taking: Reported on 01/19/2018 08/12/16   Charlott Rakes, MD  ibuprofen (ADVIL) 800 MG tablet Take 1 tablet (800 mg total) by mouth 3 (three) times daily. Prn pain 09/22/19   Argentina Donovan, PA-C     Objective:  EXAM:   Vitals:   09/22/19 1507  BP: 130/79  Pulse: 86  Resp: 18  Temp: 98 F (36.7 C)  SpO2: 97%  Weight: 154 lb (69.9 kg)  Height: 5\' 1"  (1.549 m)    General appearance : A&OX3. NAD. Non-toxic-appearing HEENT: Atraumatic and Normocephalic.  PERRLA. EOM intact.  Neck: supple, no JVD. No cervical lymphadenopathy. No thyromegaly Chest/Lungs:  Breathing-non-labored, Good air entry bilaterally, breath sounds normal without rales, rhonchi, or wheezing  CVS: S1 S2 regular, no murmurs, gallops, rubs  Abdomen: Bowel sounds present, Non tender and not distended with no gaurding, rigidity or rebound.  No CVA TTP.   Back full s&ROM, pain in R flank.  Mild R paraspinus spasm.   Extremities: Bilateral Lower Ext shows no edema, both legs are warm to touch with = pulse throughout  Neurology:  CN II-XII grossly intact, Non focal.   Psych:  TP linear. J/I WNL. Normal speech. Appropriate eye contact and affect.  Skin:  No Rash  Data Review Lab Results  Component Value Date   HGBA1C 6.6 05/12/2019   HGBA1C 5.9 04/22/2018   HGBA1C 6.6 12/01/2017     Assessment & Plan   1. Type 2 diabetes mellitus without complication, without long-term current use of insulin (HCC) Not controlled.  I have had a lengthy discussion and provided education about insulin resistance and the intake of too much sugar/refined carbohydrates.  I have  advised the patient to work at a goal of eliminating sugary drinks, candy, desserts, sweets, refined sugars, processed foods, and white carbohydrates.  The patient expresses understanding.  - POCT URINALYSIS DIP (CLINITEK) - Glucose (CBG) - Basic metabolic panel - Hemoglobin A1c  2. Chronic right-sided low back pain without sciatica Likely musculoskeletal - Basic metabolic panel - DG Abd 1 View; Future - ibuprofen (ADVIL) 800 MG tablet; Take 1 tablet (800 mg total) by mouth 3 (three) times daily. Prn pain  Dispense: 60 tablet; Refill: 0 - POCT urine pregnancy  3. Language barrier stratus interpreters used and additional time performing visit was required.   Patient have been counseled extensively about nutrition and exercise  Return in about 3 months (around 12/23/2019) for see PCP for chronic conditions.  The patient was given clear instructions to go to ER or return to medical center if symptoms don't improve, worsen or new problems develop. The patient verbalized understanding. The patient was told to call to get lab results if they haven't heard anything in the next week.     Freeman Caldron, PA-C [facility] and [facility], [place]   09/22/2019, 3:51 PM

## 2019-09-22 NOTE — Progress Notes (Signed)
C /o mid back pain X 3 months particularly on her periods CBG 180 non fasting

## 2019-09-23 ENCOUNTER — Other Ambulatory Visit: Payer: Self-pay | Admitting: Physician Assistant

## 2019-09-23 DIAGNOSIS — E119 Type 2 diabetes mellitus without complications: Secondary | ICD-10-CM

## 2019-09-23 LAB — BASIC METABOLIC PANEL
BUN/Creatinine Ratio: 22 (ref 9–23)
BUN: 13 mg/dL (ref 6–24)
CO2: 17 mmol/L — ABNORMAL LOW (ref 20–29)
Calcium: 10 mg/dL (ref 8.7–10.2)
Chloride: 105 mmol/L (ref 96–106)
Creatinine, Ser: 0.6 mg/dL (ref 0.57–1.00)
GFR calc Af Amer: 124 mL/min/{1.73_m2} (ref >59)
GFR calc non Af Amer: 107 mL/min/{1.73_m2} (ref >59)
Glucose: 163 mg/dL — ABNORMAL HIGH (ref 65–99)
Potassium: 4.5 mmol/L (ref 3.5–5.2)
Sodium: 144 mmol/L (ref 134–144)

## 2019-09-23 LAB — HEMOGLOBIN A1C
Est. average glucose Bld gHb Est-mCnc: 140 mg/dL
Hgb A1c MFr Bld: 6.5 % — ABNORMAL HIGH (ref 4.8–5.6)

## 2019-09-23 MED ORDER — METFORMIN HCL 500 MG PO TABS: ORAL_TABLET | ORAL | 3 refills | Status: DC

## 2019-09-24 ENCOUNTER — Telehealth (INDEPENDENT_AMBULATORY_CARE_PROVIDER_SITE_OTHER): Payer: Self-pay

## 2019-09-24 NOTE — Telephone Encounter (Signed)
-----   Message from Argentina Donovan, [place] sent at 09/23/2019  2:21 PM EST ----- Please call patient.  Her kidney blood work is normal.  The pain is likely musculoskeletal and not from her kidneys, however, her diabetes can damage the kidneys in time.  So, it is very important to eat less sugar and carbohydrates and drink 8-10 glasses of water daily.  Follow-up as planned. thanks, Freeman Caldron, PA-C

## 2019-09-24 NOTE — Telephone Encounter (Signed)
Call placed to patient using pacific interpreter [phone]) left message informing patient that her xray did not show any problems with her kidneys. No kidney stones. Kidney blood work. Pain likely muscloskeletal. Diabetes has worsened, metformin increased to 2 tables twice a day with food.  Diabetes can damage kidneys in time. It is very important to eat less sugars and carbohydrates. Drink at least 8-10 glasses of water daily. Follow up as planned. Return call to [facility] at [phone] with any questions or concerns. Nat Christen, CMA

## 2020-02-07 ENCOUNTER — Other Ambulatory Visit: Payer: Self-pay

## 2020-02-07 ENCOUNTER — Ambulatory Visit: Payer: Medicaid Other | Attending: Family Medicine | Admitting: Family Medicine

## 2020-02-07 VITALS — BP 126/74 | HR 78 | Wt 158.4 lb

## 2020-02-07 DIAGNOSIS — E042 Nontoxic multinodular goiter: Secondary | ICD-10-CM | POA: Diagnosis not present

## 2020-02-07 DIAGNOSIS — E785 Hyperlipidemia, unspecified: Secondary | ICD-10-CM

## 2020-02-07 DIAGNOSIS — E119 Type 2 diabetes mellitus without complications: Secondary | ICD-10-CM

## 2020-02-07 LAB — POCT GLYCOSYLATED HEMOGLOBIN (HGB A1C): HbA1c, POC (controlled diabetic range): 6.1 % (ref 0.0–7.0)

## 2020-02-07 LAB — GLUCOSE, POCT (MANUAL RESULT ENTRY): POC Glucose: 109 mg/dL — AB (ref 70–99)

## 2020-02-07 MED ORDER — ATORVASTATIN CALCIUM 20 MG PO TABS: 20.0000 mg | ORAL_TABLET | Freq: Every day | ORAL | 6 refills | Status: DC

## 2020-02-07 MED ORDER — METFORMIN HCL 500 MG PO TABS: ORAL_TABLET | ORAL | 3 refills | Status: DC

## 2020-02-07 NOTE — Patient Instructions (Signed)
Diabetes mellitus y nutricin, en adultos Diabetes Mellitus and Nutrition, Adult Si sufre de diabetes (diabetes mellitus), es muy importante tener hbitos alimenticios saludables debido a que sus niveles de Designer, television/film set sangre (glucosa) se ven afectados en gran medida por lo que come y bebe. [place] alimentos saludables en las cantidades [place], aproximadamente a la [employer], [place] ayudar a:  Aeronautical engineer glucemia.  Disminuir el riesgo de sufrir una enfermedad cardaca.  Mejorar la presin arterial.  Science writer o mantener un peso saludable. Todas las personas que sufren de diabetes son diferentes y cada una tiene necesidades diferentes en cuanto a un plan de alimentacin. El mdico puede recomendarle que trabaje con un especialista en dietas y nutricin (nutricionista) para Financial trader plan para usted. Su plan de alimentacin puede variar segn factores como:  Las caloras que necesita.  Los medicamentos que toma.  Su peso.  Sus niveles de glucemia, presin arterial y colesterol.  Su nivel de [place].  Otras afecciones que tenga, como enfermedades cardacas o renales. Cmo me afectan los carbohidratos? Los carbohidratos, o hidratos de carbono, afectan su nivel de glucemia ms que cualquier otro tipo de alimento. La ingesta de carbohidratos naturalmente aumenta la cantidad de [employer]. El recuento de carbohidratos es un mtodo destinado a Catering manager un registro de la cantidad de carbohidratos que se consumen. El recuento de carbohidratos es importante para Theatre manager la glucemia a un nivel saludable, especialmente si utiliza insulina o toma determinados medicamentos por va oral para la diabetes. Es importante conocer la cantidad de carbohidratos que se pueden ingerir en cada comida sin correr Engineer, manufacturing. Esto es Psychologist, forensic. Su nutricionista puede ayudarlo a calcular la cantidad de carbohidratos que debe ingerir en cada comida y en cada  refrigerio. Entre los alimentos que contienen carbohidratos, se incluyen:  Pan, cereal, arroz, pastas y galletas.  Papas y maz.  Guisantes, frijoles y lentejas.  Leche y Estate agent.  Lambert Mody y [place].  Postres, como pasteles, galletas, helado y caramelos. Cmo me afecta el alcohol? El alcohol puede provocar disminuciones sbitas de la glucemia (hipoglucemia), especialmente si utiliza insulina o toma determinados medicamentos por va oral para la diabetes. La hipoglucemia es una afeccin potencialmente mortal. Los sntomas de la hipoglucemia (somnolencia, mareos y confusin) son similares a los sntomas de haber consumido demasiado alcohol. Si el mdico afirma que el alcohol es seguro para usted, [place] estas pautas:  Limite el consumo de alcohol a no ms de 26mdida por da si es mujer y no est [place] y a 219midas si es hombre. Una medida equivale a 12oz (35564mde cerveza, 5oz (148m60me vino o 1oz (44ml75m bebidas alcohlicas de alta graduacin.  No beba con el estmago vaco.  Mantngase hidratado bebiendo agua, refrescos dietticos o t helado sin azcar.  Tenga en cuenta que los refrescos comunes, los jugos y otras bebida para mezclOptician, dispensingen contener mucha azcar y se deben contar como carbohidratos. Cules son algunos consejos para seguir este plan?  Leer las etiquetas de los alimentos  Comience por leer el tamao de la porcin en la "Informacin nutricional" en las etiquetas de los alimentos envasados y las bebidas. La cantidad de caloras, carbohidratos, grasas y otros nutrientes mencionados en la etiqueta se basan en una porcin del alimento. Muchos alimentos contienen ms de una porcin por envase.  Verifique la cantidad total de gramos (g) de carbohidratos totales en una porcin. Puede calcular la cantidad de porciones de carbohidratos al dividir el  total de carbohidratos por 15. Por ejemplo, si un alimento tiene un total de 30g de carbohidratos, equivale a 2  porciones de carbohidratos.  Verifique la cantidad de gramos (g) de grasas saturadas y grasas trans en una porcin. Escoja alimentos que no contengan grasa o que tengan un bajo contenido.  Verifique la cantidad de miligramos (mg) de sal (sodio) en una porcin. [place] [employer] de las personas deben limitar la ingesta de sodio total a menos de 2325m por dTraining and development officer  Siempre consulte la informacin nutricional de los alimentos etiquetados como "con bajo contenido de grasa" o "sin grasa". Estos alimentos pueden tener un mayor contenido de aLocation manageragregada o carbohidratos refinados, y deben evitarse.  Hable con su nutricionista para identificar sus objetivos diarios en cuanto a los nutrientes mencionados en la etiqueta. Al ir de compras  Evite comprar alimentos procesados, enlatados o precocinados. Estos alimentos tienden a tSpecial educational needs teachermayor cantidad de gGerlach sodio y azcar agregada.  Compre en la zona exterior de la tienda de comestibles. Esta zona incluye frutas y verduras frescas, granos a granel, carnes frescas y productos lcteos frescos. Al cocinar  Utilice mtodos de coccin a baja temperatura, como hornear, en lugar de mtodos de coccin a alta temperatura, como frer en abundante aceite.  Cocine con aceites saludables, como el aceite de oPeebles canola o gMilton  Evite cocinar con manteca, crema o carnes con alto contenido de grasa. Planificacin de las comidas  Coma las comidas y los refrigerios regularmente, preferentemente a la misma hora todos lMorrilton Evite pasar largos perodos de tiempo sin comer.  Consuma alimentos ricos en fibra, como frutas frescas, verduras, frijoles y cereales integrales. Consulte a su nutricionista sobre cuntas porciones de carbohidratos puede consumir en cada comida.  Consuma entre 4 y 6 onzas (oz) de protenas magras por da, como carnes mEllsworth pollo, pescado, huevos o tofu. Una onza de protena magra equivale a: ? 1 onza de carne, pollo o  pescado. ? 1huevo. ?  taza de tofu.  Coma algunos alimentos por da que contengan grasas saludables, como aguacates, frutos secos, semillas y pescado. Estilo de vida  Controle su nivel de glucemia con regularidad.  Haga actividad fsica habitualmente como se lo haya indicado el mdico. Esto puede incluir lo siguiente: ? 1551mutos semanales de ejercicio de intensidad moderada o alta. Esto podra incluir caminatas dinmicas, ciclismo o gimnasia acutica. ? Realizar ejercicios de elongacin y de fortalecimiento, como yoga o levantamiento de pesas, por lo menos 2veces por semana.  Tome los meTenneco Ince lo haya indicado el mdico.  No consuma ningn producto que contenga nicotina o tabaco, como cigarrillos y ciPsychologist, sport and exerciseSi necesita ayuda para dejar de fumar, consulte al mdHess Corporationon un asesor o instructor en diabetes para identificar estrategias para controlar el estrs y cualquier desafo emocional y social. Preguntas para hacerle al mdico  Es necesario que consulte a unRadio broadcast assistantn el cuidado de la diabetes?  Es necesario que me rena con un nutricionista?  A qu nmero puedo llamar si tengo preguntas?  Cules son los mejores momentos para controlar la glucemia? Dnde encontrar ms informacin:  Asociacin Estadounidense de la Diabetes (American Diabetes Association): diabetes.org  Academia de Nutricin y DiInformation systems managerAcademy of Nutrition and Dietetics): www.eatright.orAieaiabetes y las Enfermedades Digestivas y Renales (NSt John'S Episcopal Hospital [facility] Shoref Diabetes and Digestive and Kidney Diseases, NIH): wwDesMoinesFuneral.dkesumen  Un plan de alimentacin saludable lo ayudar a coAeronautical engineerlucemia y maTheatre managern estilo de vida saludable.  Trabajar con un especialista en dietas y nutricin (nutricionista) puede ayudarlo a elaborar el mejor plan de alimentacin para usted.  Tenga en cuenta que los carbohidratos (hidratos de  carbono) y el alcohol tienen efectos inmediatos en sus niveles de glucemia. Es importante contar los carbohidratos que ingiere y consumir alcohol con prudencia. Esta informacin no tiene como fin reemplazar el consejo del mdico. Asegrese de hacerle al mdico cualquier pregunta que tenga. Document Revised: 03/18/2017 Document Reviewed: 10/28/2016 Elsevier Patient Education  2020 Elsevier Inc.  

## 2020-02-07 NOTE — Progress Notes (Signed)
Subjective:  Patient ID: Debbie Lawson, female    DOB: March 25, 1971  Age: 49 y.o. 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 Last seen by ENT at [facility] on 11/2018 due to multiple thyroid nodules and FNA biopsy with suspicion for malignancy at a 50% chance and surgery planned however the patient opted to hold surgery until the pandemic is over. On revisiting the issue again today daughter states they have been busy and have not gotten around to scheduling an appointment.  She has been compliant with her Diabetic medication and denies presence of hypoglycemia, blurry vision or numbness in extremities. She is accompanied by her daughter to today's visit and has no additional concerns today. Past Medical History:  Diagnosis Date  . Hyperlipidemia 04/15/2017    Past Surgical History:  Procedure Laterality Date  . NO PAST SURGERIES      Family History  Problem Relation Age of Onset  . Diabetes Mother     No Known Allergies  Outpatient Medications Prior to Visit  Medication Sig Dispense Refill  . ACCU-CHEK SOFTCLIX LANCETS lancets USE AS DIRECTED DAILY 100 each 2  . Blood Glucose Monitoring Suppl (ACCU-CHEK AVIVA) device Use as instructed daily. 1 each 0  . Cholecalciferol (VITAMIN D PO) Take 1 tablet by mouth daily.    Marland Kitchen glucose blood (ACCU-CHEK AVIVA) test strip Daily before breakfast 30 each 12  . ibuprofen (ADVIL) 800 MG tablet Take 1 tablet (800 mg total) by mouth 3 (three) times daily. Prn pain 60 tablet 0  . terbinafine (LAMISIL) 250 MG tablet Take 1 tablet (250 mg total) by mouth daily. 30 tablet 2  . atorvastatin (LIPITOR) 20 MG tablet Take 1 tablet (20 mg total) by mouth daily. 30 tablet 6  . metFORMIN (GLUCOPHAGE) 500 MG tablet TAKE 2 TABLETs BY MOUTH TWICE  DAILY WITH A MEAL 120 tablet 3  . clotrimazole (LOTRIMIN) 1 % cream Apply 1 application topically 2 (two) times daily. (Patient not taking: Reported on 01/19/2018) 45 g 1   No facility-administered medications prior to visit.     ROS Review of Systems  Constitutional: Negative for activity change, appetite change and fatigue.  HENT: Negative for congestion, sinus pressure and sore throat.   Eyes: Negative for visual disturbance.  Respiratory: Negative for cough, chest tightness, shortness of breath and wheezing.   Cardiovascular: Negative for chest pain and palpitations.  Gastrointestinal: Negative for abdominal distention, abdominal pain and constipation.  Endocrine: Negative for polydipsia.  Genitourinary: Negative for dysuria and frequency.  Musculoskeletal: Negative for arthralgias and back pain.  Skin: Negative for rash.  Neurological: Negative for tremors, light-headedness and numbness.  Hematological: Does not bruise/bleed easily.  Psychiatric/Behavioral: Negative for agitation and behavioral problems.    Objective:  BP 126/74   Pulse 78   Wt 158 lb 6.4 oz (71.8 kg)   SpO2 100%   BMI 29.93 kg/m   BP/Weight 02/07/2020 09/22/2019 41/96/2229  Systolic BP 798 921 194  Diastolic BP 74 79 82  Wt. (Lbs) 158.4 154 151.6  BMI 29.93 29.1 28.64      Physical Exam Constitutional:      Appearance: She is well-developed.  Neck:     Vascular: No JVD.  Cardiovascular:     Rate and Rhythm: Normal rate.     Heart sounds: Normal heart sounds.  No murmur heard.   Pulmonary:     Effort: Pulmonary effort is normal.     Breath sounds: Normal breath sounds. No wheezing or rales.  Chest:     Chest wall: No tenderness.  Abdominal:     General: Bowel sounds are normal. There is no distension.     Palpations: Abdomen is soft. There is no mass.     Tenderness: There is no abdominal tenderness.  Musculoskeletal:        General: Normal range of motion.     Right lower leg: No edema.      Left lower leg: No edema.  Neurological:     Mental Status: She is alert and oriented to person, place, and time.  Psychiatric:        Mood and Affect: Mood normal.     CMP Latest Ref Rng & Units 09/22/2019 05/13/2019 12/01/2017  Glucose 65 - 99 mg/dL 163(H) 113(H) 136(H)  BUN 6 - 24 mg/dL 13 5(L) 10  Creatinine 0.57 - 1.00 mg/dL 0.60 0.69 0.59  Sodium 134 - 144 mmol/L 144 140 137  Potassium 3.5 - 5.2 mmol/L 4.5 4.0 4.4  Chloride 96 - 106 mmol/L 105 101 103  CO2 20 - 29 mmol/L 17(L) 25 20  Calcium 8.7 - 10.2 mg/dL 10.0 9.1 9.2  Total Protein 6.0 - 8.5 g/dL - 6.8 6.8  Total Bilirubin 0.0 - 1.2 mg/dL - 0.5 0.4  Alkaline Phos 39 - 117 IU/L - 74 65  AST 0 - 40 IU/L - 23 13  ALT 0 - 32 IU/L - 21 20    Lipid Panel     Component Value Date/Time   CHOL 101 05/13/2019 1125   TRIG 154 (H) 05/13/2019 1125   HDL 24 (L) 05/13/2019 1125   CHOLHDL 4.2 05/13/2019 1125   CHOLHDL 5.4 (H) 08/12/2016 0932   VLDL 27 07/11/2010 2115   LDLCALC 50 05/13/2019 1125    CBC    Component Value Date/Time   WBC 6.2 08/12/2016 0932   RBC 4.87 08/12/2016 0932   HGB 15.1 08/12/2016 0932   HCT 44.6 08/12/2016 0932   PLT 331 08/12/2016 0932   MCV 91.6 08/12/2016 0932   MCH 31.0 08/12/2016 0932   MCHC 33.9 08/12/2016 0932   RDW 12.9 08/12/2016 0932   LYMPHSABS 2,170 08/12/2016 0932   MONOABS 558 08/12/2016 0932   EOSABS 124 08/12/2016 0932   BASOSABS 0 08/12/2016 0932    Lab Results  Component Value Date   HGBA1C 6.1 02/07/2020    Assessment & Plan:  1. Type 2 diabetes mellitus without complication, without long-term current use of insulin (HCC) Controlled with A1c of 6.1; goal is <7.0 Continue current regimen - POCT glucose (manual entry) - POCT glycosylated hemoglobin (Hb A1C) - metFORMIN (GLUCOPHAGE) 500 MG tablet; TAKE 2 TABLETs BY MOUTH TWICE DAILY WITH A MEAL  Dispense: 120 tablet; Refill: 3  2. Hyperlipidemia, unspecified hyperlipidemia type Controlled Low cholesterol diet -  atorvastatin (LIPITOR) 20 MG tablet; Take 1 tablet (20 mg total) by mouth daily.  Dispense: 30 tablet; Refill: 6  3. Multinodular thyroid Advised to schedule appointment with her ENT due to concerning pathology findings  Meds ordered this encounter  Medications  . atorvastatin (LIPITOR) 20 MG tablet    Sig: Take 1 tablet (20 mg total) by mouth daily.    Dispense:  30 tablet    Refill:  6  . metFORMIN (GLUCOPHAGE) 500 MG tablet    Sig: TAKE 2 TABLETs BY  MOUTH TWICE DAILY WITH A MEAL    Dispense:  120 tablet    Refill:  3    Please consider 90 day supplies to promote better adherence    Follow-up: Return in about 1 month (around 03/09/2020) for complete physical exam.       Charlott Rakes, MD, FAAFP. [facility] and [place], [place]   02/08/2020, 6:44 AM

## 2020-02-08 ENCOUNTER — Encounter: Payer: Self-pay | Admitting: Family Medicine

## 2020-02-23 ENCOUNTER — Other Ambulatory Visit: Payer: Self-pay | Admitting: Family Medicine

## 2020-02-23 DIAGNOSIS — E119 Type 2 diabetes mellitus without complications: Secondary | ICD-10-CM

## 2020-02-24 MED ORDER — ACCU-CHEK GUIDE ME W/DEVICE KIT: PACK | 0 refills | Status: AC

## 2020-03-15 ENCOUNTER — Encounter: Payer: Self-pay | Admitting: Family Medicine

## 2020-03-15 ENCOUNTER — Other Ambulatory Visit: Payer: Self-pay

## 2020-03-15 ENCOUNTER — Ambulatory Visit: Payer: Medicaid Other | Attending: Family Medicine | Admitting: Family Medicine

## 2020-03-15 VITALS — BP 122/74 | HR 91 | Ht 61.0 in | Wt 159.8 lb

## 2020-03-15 DIAGNOSIS — Z23 Encounter for immunization: Secondary | ICD-10-CM

## 2020-03-15 DIAGNOSIS — Z1211 Encounter for screening for malignant neoplasm of colon: Secondary | ICD-10-CM | POA: Diagnosis not present

## 2020-03-15 DIAGNOSIS — Z0000 Encounter for general adult medical examination without abnormal findings: Secondary | ICD-10-CM

## 2020-03-15 DIAGNOSIS — Z1231 Encounter for screening mammogram for malignant neoplasm of breast: Secondary | ICD-10-CM | POA: Diagnosis not present

## 2020-03-15 NOTE — Progress Notes (Signed)
Subjective:  Patient ID: Debbie Lawson, female    DOB: 1971/03/29  Age: 49 y.o. MRN: [id number]  CC: Annual Exam and Gynecologic Exam   HPI Debbie Lawson presents for a complete physical exam but is unfortunately on her menstrual cycle at the moment hence the Pap smear cannot be done. She is due for mammogram and colonoscopy  Past Medical History:  Diagnosis Date  . Hyperlipidemia 04/15/2017    Past Surgical History:  Procedure Laterality Date  . NO PAST SURGERIES      Family History  Problem Relation Age of Onset  . Diabetes Mother     No Known Allergies  Outpatient Medications Prior to Visit  Medication Sig Dispense Refill  . ACCU-CHEK SOFTCLIX LANCETS lancets USE AS DIRECTED DAILY 100 each 2  . atorvastatin (LIPITOR) 20 MG tablet Take 1 tablet (20 mg total) by mouth daily. 30 tablet 6  . Blood Glucose Monitoring Suppl (ACCU-CHEK AVIVA) device Use as instructed daily. 1 each 0  . Blood Glucose Monitoring Suppl (ACCU-CHEK GUIDE ME) w/Device KIT Use to check blood sugar one daily. E11.9 1 kit 0  . Cholecalciferol (VITAMIN D PO) Take 1 tablet by mouth daily.    Marland Kitchen glucose blood test strip USE 1 STRIP TO CHECK GLUCOSE ONCE DAILY BEFORE BREAKFAST 100 strip 3  . ibuprofen (ADVIL) 800 MG tablet Take 1 tablet (800 mg total) by mouth 3 (three) times daily. Prn pain 60 tablet 0  . metFORMIN (GLUCOPHAGE) 500 MG tablet TAKE 2 TABLETs BY MOUTH TWICE DAILY WITH A MEAL 120 tablet 3  . terbinafine (LAMISIL) 250 MG tablet Take 1 tablet (250 mg total) by mouth daily. 30 tablet 2  . clotrimazole (LOTRIMIN) 1 % cream Apply 1 application topically 2 (two) times daily. (Patient not taking: Reported on 01/19/2018) 45 g 1   No facility-administered medications prior to visit.     ROS Review of Systems  Constitutional: Negative for activity change, appetite change and fatigue.  HENT: Negative for congestion, sinus pressure and sore throat.   Eyes: Negative for visual  disturbance.  Respiratory: Negative for cough, chest tightness, shortness of breath and wheezing.   Cardiovascular: Negative for chest pain and palpitations.  Gastrointestinal: Negative for abdominal distention, abdominal pain and constipation.  Endocrine: Negative for polydipsia.  Genitourinary: Negative for dysuria and frequency.  Musculoskeletal: Negative for arthralgias and back pain.  Skin: Negative for rash.  Neurological: Negative for tremors, light-headedness and numbness.  Hematological: Does not bruise/bleed easily.  Psychiatric/Behavioral: Negative for agitation and behavioral problems.    Objective:  BP 122/74   Pulse 91   Ht 5' 1"  (1.549 m)   Wt 159 lb 12.8 oz (72.5 kg)   SpO2 99%   BMI 30.19 kg/m   BP/Weight 03/15/2020 8/78/6767 2/0/9470  Systolic BP 962 836 629  Diastolic BP 74 74 79  Wt. (Lbs) 159.8 158.4 154  BMI 30.19 29.93 29.1      Physical Exam Constitutional:      General: She is not in acute distress.    Appearance: She is well-developed. She is not diaphoretic.  HENT:     Head: Normocephalic.     Right Ear: External ear normal.     Left Ear: External ear normal.     Nose: Nose normal.  Eyes:     Conjunctiva/sclera: Conjunctivae normal.     Pupils: Pupils are equal, round, and reactive to light.  Neck:     Vascular: No JVD.  Cardiovascular:  Rate and Rhythm: Normal rate and regular rhythm.     Heart sounds: Normal heart sounds. No murmur heard.  No gallop.   Pulmonary:     Effort: Pulmonary effort is normal. No respiratory distress.     Breath sounds: Normal breath sounds. No wheezing or rales.  Chest:     Chest wall: No tenderness.     Breasts:        Right: No mass or tenderness.        Left: No mass or tenderness.  Abdominal:     General: Bowel sounds are normal. There is no distension.     Palpations: Abdomen is soft. There is no mass.     Tenderness: There is no abdominal tenderness.  Musculoskeletal:        General: No  tenderness. Normal range of motion.     Cervical back: Normal range of motion.  Skin:    General: Skin is warm and dry.  Neurological:     Mental Status: She is alert and oriented to person, place, and time.     Deep Tendon Reflexes: Reflexes are normal and symmetric.     CMP Latest Ref Rng & Units 09/22/2019 05/13/2019 12/01/2017  Glucose 65 - 99 mg/dL 163(H) 113(H) 136(H)  BUN 6 - 24 mg/dL 13 5(L) 10  Creatinine 0.57 - 1.00 mg/dL 0.60 0.69 0.59  Sodium 134 - 144 mmol/L 144 140 137  Potassium 3.5 - 5.2 mmol/L 4.5 4.0 4.4  Chloride 96 - 106 mmol/L 105 101 103  CO2 20 - 29 mmol/L 17(L) 25 20  Calcium 8.7 - 10.2 mg/dL 10.0 9.1 9.2  Total Protein 6.0 - 8.5 g/dL - 6.8 6.8  Total Bilirubin 0.0 - 1.2 mg/dL - 0.5 0.4  Alkaline Phos 39 - 117 IU/L - 74 65  AST 0 - 40 IU/L - 23 13  ALT 0 - 32 IU/L - 21 20    Lipid Panel     Component Value Date/Time   CHOL 101 05/13/2019 1125   TRIG 154 (H) 05/13/2019 1125   HDL 24 (L) 05/13/2019 1125   CHOLHDL 4.2 05/13/2019 1125   CHOLHDL 5.4 (H) 08/12/2016 0932   VLDL 27 07/11/2010 2115   LDLCALC 50 05/13/2019 1125    CBC    Component Value Date/Time   WBC 6.2 08/12/2016 0932   RBC 4.87 08/12/2016 0932   HGB 15.1 08/12/2016 0932   HCT 44.6 08/12/2016 0932   PLT 331 08/12/2016 0932   MCV 91.6 08/12/2016 0932   MCH 31.0 08/12/2016 0932   MCHC 33.9 08/12/2016 0932   RDW 12.9 08/12/2016 0932   LYMPHSABS 2,170 08/12/2016 0932   MONOABS 558 08/12/2016 0932   EOSABS 124 08/12/2016 0932   BASOSABS 0 08/12/2016 0932    Lab Results  Component Value Date   HGBA1C 6.1 02/07/2020    Assessment & Plan:  1. Annual physical exam Counseled on 150 minutes of exercise per week, healthy eating (including decreased daily intake of saturated fats, cholesterol, added sugars, sodium), STI prevention, routine healthcare maintenance.   2. Encounter for screening mammogram for malignant neoplasm of breast - MM 3D SCREEN BREAST BILATERAL; Future  3.  Screening for colon cancer - Fecal occult blood, imunochemical([facility])    No orders of the defined types were placed in this encounter.   Follow-up: Return in about 6 weeks (around 04/26/2020) for PAP smear.       Charlott Rakes, MD, FAAFP. [place] [facility] [place],  [place] [phone]   03/15/2020, 3:38 PM

## 2020-03-15 NOTE — Patient Instructions (Signed)
Mantenimiento de Technical sales engineer en [place] [facility], Female Adoptar un estilo de vida saludable y recibir atencin preventiva son importantes para promover la salud y Musician. Consulte al mdico sobre:  El esquema adecuado para hacerse pruebas y exmenes peridicos.  Cosas que puede hacer por su cuenta para prevenir enfermedades y [employer]. Qu debo saber sobre la dieta, el peso y el ejercicio? Consuma una dieta saludable   Consuma una dieta que incluya muchas verduras, frutas, productos lcteos con bajo contenido de [place] y Advertising account planner.  No consuma muchos alimentos ricos en grasas slidas, azcares agregados o sodio. Mantenga un peso saludable El ndice de masa muscular [facility] [place]) se [place] para identificar problemas de [place]. Proporciona una estimacin de la grasa corporal basndose en el peso y la altura. Su mdico puede ayudarle a Radiation protection practitioner [place] y a Scientist, forensic o Theatre manager un peso saludable. Haga ejercicio con regularidad Haga ejercicio con regularidad. Esta es una de las prcticas ms importantes que puede hacer por su salud. La mayora de los adultos deben seguir estas pautas:  Optometrist, al menos, 185mnutos de actividad fsica por semana. El ejercicio debe aumentar la frecuencia cardaca y hNature conservation officertranspirar (ejercicio de intensidad moderada).  Hacer ejercicios de fortalecimiento por lo mHalliburton Companypor semana. Agregue esto a su plan de ejercicio de intensidad moderada.  Pasar menos tiempo sentados. Incluso la actividad fsica ligera puede ser beneficiosa. Controle sus niveles de colesterol y lpidos en la sangre Comience a realizarse anlisis de lpidos y cResearch officer, trade unionen la sangre a los 20aos y luego reptalos cada 5aos. Hgase controlar los niveles de colesterol con mayor frecuencia si:  Sus niveles de lpidos y colesterol son altos.  Es mayor de 40aos.  Presenta un alto riesgo de padecer enfermedades cardacas. Qu debo saber sobre las pruebas de  deteccin del cncer? Segn su historia clnica y sus antecedentes familiares, es posible que deba realizarse pruebas de deteccin del cncer en diferentes edades. Esto puede incluir pruebas de deteccin de lo siguiente:  Cncer de mama.  Cncer de cuello uterino.  Cncer colorrectal.  Cncer de piel.  Cncer de pulmn. Qu debo saber sobre la enfermedad cardaca, la diabetes y la hipertensin arterial? Presin arterial y enfermedad cardaca  La hipertensin arterial causa enfermedades cardacas y aSerbiael riesgo de accidente cerebrovascular. Es ms probable que esto se manifieste en las personas que tienen lecturas de presin arterial alta, tienen ascendencia africana o tienen sobrepeso.  Hgase controlar la presin arterial: ? Cada 3 a 5 aos si tiene entre 18 y 389aos. ? Todos los aos si es mayor de 4[place] Diabetes Realcese exmenes de deteccin de la diabetes con regularidad. Este anlisis revisa el nivel de azcar en la sangre en a[place]hurst Hgase las pruebas de deteccin:  Cada tresaos despus de los 428aosde edad si tiene un peso normal y un bajo riesgo de padecer diabetes.  Con ms frecuencia y a partir de uSt. Petersedad inferior si tiene sobrepeso o un alto riesgo de padecer diabetes. Qu debo saber sobre la prevencin de infecciones? Hepatitis B Si tiene un riesgo ms alto de contraer hepatitis B, debe someterse a un examen de deteccin de este virus. Hable con el mdico para averiguar si tiene riesgo de contraer la infeccin por hepatitis B. Hepatitis C Se recomienda el anlisis a:  THexion Specialty Chemicals1945 y 1965.  Todas las personas que tengan un riesgo de haber contrado hepatitis C. Enfermedades de transmisin sexual (ETS)  Hgase las  pruebas de deteccin de ITS, incluidas la gonorrea y la clamidia, si: ? Es sexualmente activa y es menor de 24aos. ? Es mayor de 24aos, y el mdico le informa que corre riesgo de tener este tipo de  infecciones. ? La actividad sexual ha cambiado desde que le hicieron la ltima prueba de deteccin y tiene un riesgo mayor de tener clamidia o gonorrea. Pregntele al mdico si usted tiene riesgo.  Pregntele al mdico si usted tiene un alto riesgo de contraer VIH. El mdico tambin puede recomendarle un medicamento recetado para ayudar a evitar la infeccin por el VIH. Si elige tomar medicamentos para prevenir el VIH, primero debe hacerse los anlisis de deteccin del VIH. Luego debe hacerse anlisis cada 3meses mientras est tomando los medicamentos. Embarazo  Si est por dejar de menstruar (fase premenopusica) y usted puede quedar embarazada, busque asesoramiento antes de quedar embarazada.  Tome de 400 a 800microgramos (mcg) de cido flico todos los das si queda embarazada.  Pida mtodos de control de la natalidad (anticonceptivos) si desea evitar un embarazo no deseado. Osteoporosis y menopausia La osteoporosis es una enfermedad en la que los huesos pierden los minerales y la fuerza por el avance de la edad. El resultado pueden ser fracturas en los huesos. Si tiene 65aos o ms, o si est en riesgo de sufrir osteoporosis y fracturas, pregunte a su mdico si debe:  Hacerse pruebas de deteccin de prdida sea.  Tomar un suplemento de calcio o de vitamina D para reducir el riesgo de fracturas.  Recibir terapia de reemplazo hormonal (TRH) para tratar los sntomas de la menopausia. Siga estas instrucciones en su casa: Estilo de vida  No consuma ningn producto que contenga nicotina o tabaco, como cigarrillos, cigarrillos electrnicos y tabaco de mascar. Si necesita ayuda para dejar de fumar, consulte al mdico.  No consuma drogas.  No comparta agujas.  Solicite ayuda a su mdico si necesita apoyo o informacin para abandonar las drogas. Consumo de alcohol  No beba alcohol si: ? Su mdico le indica no hacerlo. ? Est embarazada, puede estar embarazada o est tratando de quedar  embarazada.  Si bebe alcohol: ? Limite la cantidad que consume de 0 a 1 medida por da. ? Limite la ingesta si est amamantando.  Est atento a la cantidad de alcohol que hay en las bebidas que toma. En los [place], una medida equivale a una botella de cerveza de 12oz (355ml), un vaso de vino de 5oz (148ml) o un vaso de una bebida alcohlica de alta graduacin de 1oz (44ml). Instrucciones generales  Realcese los estudios de rutina de la salud, dentales y de la vista.  Mantngase al da con las vacunas.  Infrmele a su mdico si: ? Se siente deprimida con frecuencia. ? Alguna vez ha sido vctima de maltrato o no se siente segura en su casa. Resumen  Adoptar un estilo de vida saludable y recibir atencin preventiva son importantes para promover la salud y el bienestar.  Siga las instrucciones del mdico acerca de una dieta saludable, el ejercicio y la realizacin de pruebas o exmenes para detectar enfermedades.  Siga las instrucciones del mdico con respecto al control del colesterol y la presin arterial. Esta informacin no tiene como fin reemplazar el consejo del mdico. Asegrese de hacerle al mdico cualquier pregunta que tenga. Document Revised: 07/29/2018 Document Reviewed: 07/29/2018 Elsevier Patient Education  2020 Elsevier Inc.  

## 2020-04-11 ENCOUNTER — Other Ambulatory Visit: Payer: Self-pay

## 2020-04-11 ENCOUNTER — Ambulatory Visit
Admission: RE | Admit: 2020-04-11 | Discharge: 2020-04-11 | Disposition: A | Discharge status: Home / Self Care | Payer: Medicaid Other | Source: Ambulatory Visit | Attending: Family Medicine | Admitting: Family Medicine

## 2020-04-11 DIAGNOSIS — Z1231 Encounter for screening mammogram for malignant neoplasm of breast: Secondary | ICD-10-CM | POA: Diagnosis not present

## 2020-04-13 ENCOUNTER — Other Ambulatory Visit: Payer: Self-pay | Admitting: Family Medicine

## 2020-04-13 DIAGNOSIS — R928 Other abnormal and inconclusive findings on diagnostic imaging of breast: Secondary | ICD-10-CM

## 2020-04-26 ENCOUNTER — Other Ambulatory Visit: Payer: Self-pay

## 2020-04-26 ENCOUNTER — Ambulatory Visit: Payer: Medicaid Other

## 2020-04-26 ENCOUNTER — Ambulatory Visit
Admission: RE | Admit: 2020-04-26 | Discharge: 2020-04-26 | Disposition: A | Discharge status: Home / Self Care | Payer: Medicaid Other | Source: Ambulatory Visit | Attending: Family Medicine | Admitting: Family Medicine

## 2020-04-26 DIAGNOSIS — R922 Inconclusive mammogram: Secondary | ICD-10-CM | POA: Diagnosis not present

## 2020-04-26 DIAGNOSIS — R928 Other abnormal and inconclusive findings on diagnostic imaging of breast: Secondary | ICD-10-CM

## 2020-04-28 ENCOUNTER — Telehealth: Payer: Self-pay

## 2020-04-28 NOTE — Telephone Encounter (Signed)
Patient name and DOB has been verified Patient was informed of lab results. Patient had no questions.  

## 2020-04-28 NOTE — Telephone Encounter (Signed)
-----   Message from Charlott Rakes, MD sent at 04/27/2020  2:11 PM EDT ----- Mammogram is negative for malignancy

## 2020-05-09 ENCOUNTER — Other Ambulatory Visit: Payer: Self-pay

## 2020-05-09 ENCOUNTER — Other Ambulatory Visit (HOSPITAL_COMMUNITY)
Admission: RE | Admit: 2020-05-09 | Discharge: 2020-05-09 | Disposition: A | Discharge status: Home / Self Care | Payer: Medicaid Other | Source: Ambulatory Visit | Attending: Family Medicine | Admitting: Family Medicine

## 2020-05-09 ENCOUNTER — Encounter: Payer: Self-pay | Admitting: Family Medicine

## 2020-05-09 ENCOUNTER — Ambulatory Visit: Payer: Medicaid Other | Attending: Family Medicine | Admitting: Family Medicine

## 2020-05-09 VITALS — BP 124/77 | HR 69 | Ht 61.0 in | Wt 157.0 lb

## 2020-05-09 DIAGNOSIS — Z124 Encounter for screening for malignant neoplasm of cervix: Secondary | ICD-10-CM

## 2020-05-09 DIAGNOSIS — E119 Type 2 diabetes mellitus without complications: Secondary | ICD-10-CM

## 2020-05-09 DIAGNOSIS — E785 Hyperlipidemia, unspecified: Secondary | ICD-10-CM

## 2020-05-09 MED ORDER — METFORMIN HCL 500 MG PO TABS: ORAL_TABLET | ORAL | 6 refills | Status: DC

## 2020-05-09 MED ORDER — ATORVASTATIN CALCIUM 20 MG PO TABS: 20.0000 mg | ORAL_TABLET | Freq: Every day | ORAL | 6 refills | Status: DC

## 2020-05-09 NOTE — Patient Instructions (Signed)
Prueba de Papanicolaou  Pap Test  Por qu me debo realizar esta prueba?  La prueba de Papanicolaou, tambin denominada citologa vaginal, es una prueba de cribado para detectar signos de:   Cncer de la vagina, del cuello uterino y del tero. El cuello uterino es la parte baja del tero que se abre hacia la vagina.   Infeccin.   Cambios que podran ser un signo de que se est desarrollando un cncer (cambios precancerosos).  Las mujeres deben realizarse esta prueba con regularidad. En general, debe hacerse una prueba de Papanicolaou cada 3 aos hasta alcanzar la menopausia o hasta los 65 aos. Las mujeres entre 30 y 60 aos de edad pueden elegir realizarse la prueba de Papanicolaou al mismo tiempo que la prueba del VPH (virus del papiloma humano) cada 5 aos (en lugar de cada 3 aos).  El mdico puede recomendarle que se realice pruebas de Papanicolaou con ms o menos frecuencia en funcin de sus afecciones mdicas y los resultados de la prueba de Papanicolaou anterior.  Qu tipo de muestra se toma?    El mdico recolectar una muestra de clulas de la superficie del cuello uterino. Lo har utilizando un pequeo hisopo de algodn, una esptula de plstico o un cepillo. Esta muestra se recolecta durante un examen plvico, mientras usted est recostada boca arriba sobre la mesa de examen con los pies en los descansos para pies (estribos).  En algunos casos, tambin pueden recolectarse fluidos (secreciones) del cuello uterino y la vagina.  Cmo debo prepararme para esta prueba?   Tenga en cuenta en qu etapa del ciclo menstrual se encuentra. Es posible que se le pida que vuelva a programar la prueba si est menstruando el da en que debe realizrsela.   Si el da en que debe realizarse la prueba tiene una infeccin vaginal aparente, deber volver a programar la prueba.   Siga las indicaciones del mdico acerca de lo siguiente:  ? Cambiar o suspender los medicamentos que toma habitualmente. Algunos  medicamentos pueden provocar resultados anormales de la prueba, como los digitlicos y la tetraciclina.  ? Evite las duchas vaginales o los baos de inmersin el da de la prueba o el da anterior.  Informe al mdico acerca de lo siguiente:   Cualquier alergia que tenga.   Todos los medicamentos que utiliza, incluidos vitaminas, hierbas, gotas oftlmicas, cremas y medicamentos de venta libre.   Cualquier enfermedad de la sangre que tenga.   Cirugas a las que se someti.   Cualquier afeccin mdica que tenga.   Si est embarazada o podra estarlo.  Cmo se informan los resultados?  Los resultados de la prueba se informarn como anormales o normales.  Puede producirse un resultado positivo falso. Este tipo de resultado es incorrecto porque indica que una enfermedad est presente cuando en realidad no lo est.  Puede producirse un resultado negativo falso. Este tipo de resultado es incorrecto porque indica que una enfermedad no est presente cuando en realidad lo est.  Qu significan los resultados?  Un resultado normal en la prueba significa que no tiene signos de cncer de la vagina, del cuello uterino o del tero.  Un resultado anormal puede significar que tiene:   Cncer. Una prueba de Papanicolaou por s sola no es suficiente para diagnosticar cncer. En este caso, se le realizarn ms pruebas.   Cambios precancerosos en la vagina, cuello uterino o tero.   Inflamacin del cuello uterino.   Enfermedades de transmisin sexual (ETS).   Infecciones por hongos.     Cundo estarn disponibles mis resultados?  Cmo obtendr mis resultados?  Cules son mis opciones de tratamiento?  Qu otras pruebas necesito?  Cules son los prximos pasos que debo seguir? Resumen  En  general, las mujeres deben hacerse una prueba de Papanicolaou cada 3 aos Teacher, English as a foreign language la menopausia o [employer] 87 aos de [place].  El mdico recolectar una muestra de clulas de la superficie del cuello uterino. Lo har utilizando un pequeo hisopo de algodn, una esptula de plstico o un cepillo.  En algunos casos, tambin pueden recolectarse fluidos (secreciones) del cuello uterino y la vagina. Esta informacin no tiene Marine scientist el consejo del mdico. Asegrese de hacerle al mdico cualquier pregunta que tenga. Document Revised: 06/17/2017 Document Reviewed: 06/17/2017 Elsevier Patient Education  2020 [employer].

## 2020-05-09 NOTE — Progress Notes (Signed)
 Subjective:  Patient ID: Debbie Lawson, female    DOB: 07/13/1971  Age: 48 y.o. 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 Last seen by [facility] at [facility] on 11/2018 due to multiple thyroid nodules andFNAbiopsy with suspicion for malignancy at a50% chance and surgery planned however the patient opted to hold surgery until the pandemic is over.  She had her annual physical exam 6 weeks ago ago however she was on her menstrual cycle at the time and is here for a Pap smear today. She has no additional concerns today. Past Medical History:  Diagnosis Date  . Hyperlipidemia 04/15/2017    Past Surgical History:  Procedure Laterality Date  . NO PAST SURGERIES      Family History  Problem Relation Age of Onset  . Diabetes Mother     No Known Allergies  Outpatient Medications Prior to Visit  Medication Sig Dispense Refill  . ACCU-CHEK SOFTCLIX LANCETS lancets USE AS DIRECTED DAILY 100 each 2  . Blood Glucose Monitoring Suppl (ACCU-CHEK AVIVA) device Use as instructed daily. 1 each 0  . Blood Glucose Monitoring Suppl (ACCU-CHEK GUIDE ME) w/Device KIT Use to check blood sugar one daily. E11.9 1 kit 0  . Cholecalciferol (VITAMIN D PO) Take 1 tablet by mouth daily.    . glucose blood test strip USE 1 STRIP TO CHECK GLUCOSE ONCE DAILY BEFORE BREAKFAST 100 strip 3  . ibuprofen (ADVIL) 800 MG tablet Take 1 tablet (800 mg total) by mouth 3 (three) times daily. Prn pain 60 tablet 0  . terbinafine (LAMISIL) 250 MG tablet Take 1 tablet (250 mg total) by mouth daily. 30 tablet 2  . atorvastatin (LIPITOR) 20 MG tablet Take 1 tablet (20 mg total) by mouth daily. 30 tablet 6  . metFORMIN (GLUCOPHAGE) 500 MG tablet TAKE 2 TABLETs BY MOUTH TWICE DAILY WITH A MEAL 120 tablet 3  .  clotrimazole (LOTRIMIN) 1 % cream Apply 1 application topically 2 (two) times daily. (Patient not taking: Reported on 01/19/2018) 45 g 1   No facility-administered medications prior to visit.     ROS Review of Systems  Constitutional: Negative for activity change, appetite change and fatigue.  HENT: Negative for congestion, sinus pressure and sore throat.   Eyes: Negative for visual disturbance.  Respiratory: Negative for cough, chest tightness, shortness of breath and wheezing.   Cardiovascular: Negative for chest pain and palpitations.  Gastrointestinal: Negative for abdominal distention, abdominal pain and constipation.  Endocrine: Negative for polydipsia.  Genitourinary: Negative for dysuria and frequency.  Musculoskeletal: Negative for arthralgias and back pain.  Skin: Negative for rash.  Neurological: Negative for tremors, light-headedness and numbness.  Hematological: Does not bruise/bleed easily.  Psychiatric/Behavioral: Negative for agitation and behavioral problems.    Objective:  BP 124/77   Pulse 69   Ht 5' 1" (1.549 m)   Wt 157 lb (71.2 kg)   SpO2 100%   BMI 29.66 kg/m   BP/Weight 05/09/2020 03/15/2020 02/07/2020  Systolic BP 124 122 126  Diastolic BP 77 74 74  Wt. (Lbs) 157 159.8 158.4  BMI 29.66 30.19 29.93      Physical Exam Constitutional:      Appearance: She is well-developed.  Neck:     Vascular: No JVD.  Cardiovascular:     Rate and   Rhythm: Normal rate.     Heart sounds: Normal heart sounds. No murmur heard.   Pulmonary:     Effort: Pulmonary effort is normal.     Breath sounds: Normal breath sounds. No wheezing or rales.  Chest:     Chest wall: No tenderness.  Abdominal:     General: Bowel sounds are normal. There is no distension.     Palpations: Abdomen is soft. There is no mass.     Tenderness: There is no abdominal tenderness.  Genitourinary:    Comments: Vitiligo on skin of external genitalia.  Vagina, cervix,  adnexa-normal Musculoskeletal:        General: Normal range of motion.     Right lower leg: No edema.     Left lower leg: No edema.  Neurological:     Mental Status: She is alert and oriented to person, place, and time.  Psychiatric:        Mood and Affect: Mood normal.     CMP Latest Ref Rng & Units 09/22/2019 05/13/2019 12/01/2017  Glucose 65 - 99 mg/dL 163(H) 113(H) 136(H)  BUN 6 - 24 mg/dL 13 5(L) 10  Creatinine 0.57 - 1.00 mg/dL 0.60 0.69 0.59  Sodium 134 - 144 mmol/L 144 140 137  Potassium 3.5 - 5.2 mmol/L 4.5 4.0 4.4  Chloride 96 - 106 mmol/L 105 101 103  CO2 20 - 29 mmol/L 17(L) 25 20  Calcium 8.7 - 10.2 mg/dL 10.0 9.1 9.2  Total Protein 6.0 - 8.5 g/dL - 6.8 6.8  Total Bilirubin 0.0 - 1.2 mg/dL - 0.5 0.4  Alkaline Phos 39 - 117 IU/L - 74 65  AST 0 - 40 IU/L - 23 13  ALT 0 - 32 IU/L - 21 20    Lipid Panel     Component Value Date/Time   CHOL 101 05/13/2019 1125   TRIG 154 (H) 05/13/2019 1125   HDL 24 (L) 05/13/2019 1125   CHOLHDL 4.2 05/13/2019 1125   CHOLHDL 5.4 (H) 08/12/2016 0932   VLDL 27 07/11/2010 2115   LDLCALC 50 05/13/2019 1125    CBC    Component Value Date/Time   WBC 6.2 08/12/2016 0932   RBC 4.87 08/12/2016 0932   HGB 15.1 08/12/2016 0932   HCT 44.6 08/12/2016 0932   PLT 331 08/12/2016 0932   MCV 91.6 08/12/2016 0932   MCH 31.0 08/12/2016 0932   MCHC 33.9 08/12/2016 0932   RDW 12.9 08/12/2016 0932   LYMPHSABS 2,170 08/12/2016 0932   MONOABS 558 08/12/2016 0932   EOSABS 124 08/12/2016 0932   BASOSABS 0 08/12/2016 0932    Lab Results  Component Value Date   HGBA1C 6.1 02/07/2020    Assessment & Plan:  1. Type 2 diabetes mellitus without complication, without long-term current use of insulin (HCC) Controlled - metFORMIN (GLUCOPHAGE) 500 MG tablet; TAKE 2 TABLETs BY MOUTH TWICE DAILY WITH A MEAL  Dispense: 120 tablet; Refill: 6 - CMP14+EGFR - Microalbumin / creatinine urine ratio - Lipid panel  2. Hyperlipidemia, unspecified  hyperlipidemia type Controlled - atorvastatin (LIPITOR) 20 MG tablet; Take 1 tablet (20 mg total) by mouth daily.  Dispense: 30 tablet; Refill: 6  3. Screening for cervical cancer - Cytology - PAP([place])    Meds ordered this encounter  Medications  . metFORMIN (GLUCOPHAGE) 500 MG tablet    Sig: TAKE 2 TABLETs BY MOUTH TWICE DAILY WITH A MEAL    Dispense:  120 tablet    Refill:  6      Please consider 90 day supplies to promote better adherence  . atorvastatin (LIPITOR) 20 MG tablet    Sig: Take 1 tablet (20 mg total) by mouth daily.    Dispense:  30 tablet    Refill:  6    Follow-up: Return in about 6 months (around 11/07/2020) for chronic disease managemnt.       Charlott Rakes, MD, FAAFP. [facility] and [place], [place]   05/09/2020, 11:12 AM

## 2020-05-10 ENCOUNTER — Other Ambulatory Visit: Payer: Self-pay | Admitting: Family Medicine

## 2020-05-10 DIAGNOSIS — E785 Hyperlipidemia, unspecified: Secondary | ICD-10-CM

## 2020-05-10 LAB — CMP14+EGFR
ALT: 17 IU/L (ref 0–32)
AST: 18 IU/L (ref 0–40)
Albumin/Globulin Ratio: 1.7 (ref 1.2–2.2)
Albumin: 4.7 g/dL (ref 3.8–4.8)
Alkaline Phosphatase: 66 IU/L (ref 44–121)
BUN/Creatinine Ratio: 11 (ref 9–23)
BUN: 6 mg/dL (ref 6–24)
Bilirubin Total: 0.4 mg/dL (ref 0.0–1.2)
CO2: 23 mmol/L (ref 20–29)
Calcium: 9.4 mg/dL (ref 8.7–10.2)
Chloride: 102 mmol/L (ref 96–106)
Creatinine, Ser: 0.57 mg/dL (ref 0.57–1.00)
GFR calc Af Amer: 126 mL/min/{1.73_m2} (ref >59)
GFR calc non Af Amer: 109 mL/min/{1.73_m2} (ref >59)
Globulin, Total: 2.8 g/dL (ref 1.5–4.5)
Glucose: 125 mg/dL — ABNORMAL HIGH (ref 65–99)
Potassium: 4.1 mmol/L (ref 3.5–5.2)
Sodium: 137 mmol/L (ref 134–144)
Total Protein: 7.5 g/dL (ref 6.0–8.5)

## 2020-05-10 LAB — LIPID PANEL
Chol/HDL Ratio: 4.4 ratio (ref 0.0–4.4)
Cholesterol, Total: 200 mg/dL — ABNORMAL HIGH (ref 100–199)
HDL: 45 mg/dL (ref >39)
LDL Chol Calc (NIH): 122 mg/dL — ABNORMAL HIGH (ref 0–99)
Triglycerides: 184 mg/dL — ABNORMAL HIGH (ref 0–149)
VLDL Cholesterol Cal: 33 mg/dL (ref 5–40)

## 2020-05-10 LAB — MICROALBUMIN / CREATININE URINE RATIO
Creatinine, Urine: 40.4 mg/dL
Microalb/Creat Ratio: <7 mg/g creat (ref 0–29)
Microalbumin, Urine: <3 ug/mL

## 2020-05-10 MED ORDER — ATORVASTATIN CALCIUM 40 MG PO TABS: 40.0000 mg | ORAL_TABLET | Freq: Every day | ORAL | 6 refills | Status: DC

## 2020-05-11 LAB — CYTOLOGY - PAP
Comment: NEGATIVE
Diagnosis: NEGATIVE
High risk HPV: NEGATIVE

## 2020-05-12 ENCOUNTER — Telehealth: Payer: Self-pay

## 2020-05-12 NOTE — Telephone Encounter (Signed)
-----   Message from Charlott Rakes, MD sent at 05/10/2020  1:59 PM EDT ----- Cholesterol is elevated.  I have sent an increased dose of Lipitor to her pharmacy.  Please advised to comply with a low-cholesterol diet and exercise.

## 2020-05-12 NOTE — Telephone Encounter (Signed)
-----   Message from Charlott Rakes, MD sent at 05/11/2020  2:27 PM EDT ----- Pap smear is normal

## 2020-05-12 NOTE — Telephone Encounter (Signed)
Patient name and DOB has been verified Patient was informed of lab results. Patient had no questions.  

## 2020-05-20 ENCOUNTER — Other Ambulatory Visit: Payer: Self-pay | Admitting: Physician Assistant

## 2020-05-20 ENCOUNTER — Other Ambulatory Visit: Payer: Self-pay | Admitting: Family Medicine

## 2020-05-20 DIAGNOSIS — E119 Type 2 diabetes mellitus without complications: Secondary | ICD-10-CM

## 2020-05-20 DIAGNOSIS — G8929 Other chronic pain: Secondary | ICD-10-CM

## 2020-05-20 NOTE — Telephone Encounter (Signed)
Requested Prescriptions  Pending Prescriptions Disp Refills   ibuprofen (ADVIL) 800 MG tablet [Pharmacy Med Name: Ibuprofen 800 MG Oral Tablet] 60 tablet 0    Sig: TAKE 1 TABLET BY MOUTH THREE TIMES DAILY AS NEEDED FOR PAIN     Analgesics:  NSAIDS Failed - 05/20/2020  5:21 PM      Failed - HGB in normal range and within 360 days    Hemoglobin  Date Value Ref Range Status  08/12/2016 15.1 11.7 - 15.5 g/dL Final         Passed - Cr in normal range and within 360 days    Creat  Date Value Ref Range Status  08/12/2016 0.49 (L) 0.50 - 1.10 mg/dL Final   Creatinine, Ser  Date Value Ref Range Status  05/09/2020 0.57 0.57 - 1.00 mg/dL Final         Passed - Patient is not pregnant      Passed - Valid encounter within last 12 months    Recent Outpatient Visits          1 week ago Screening for cervical cancer   [place], Charlane Ferretti, MD   2 months ago Annual physical exam   [place], [place], MD   3 months ago Type 2 diabetes mellitus without complication, without long-term current use of insulin ([place])   [place], [place], MD   8 months ago Type 2 diabetes mellitus without complication, without long-term current use of insulin [facility])   [place], [place], [place]   1 year ago Type 2 diabetes mellitus without complication, without long-term current use of insulin ([place])   [facility] Charlott Rakes, MD

## 2020-05-20 NOTE — Telephone Encounter (Signed)
Requested Prescriptions  Pending Prescriptions Disp Refills  . Accu-Chek Softclix Lancets lancets [Pharmacy Med Name: [place] 100 each 2    Sig: USE AS DIRECTED     Endocrinology: Diabetes - Testing Supplies Passed - 05/20/2020  5:21 PM      Passed - Valid encounter within last 12 months    Recent Outpatient Visits          1 week ago Screening for cervical cancer   [place], Charlane Ferretti, MD   2 months ago Annual physical exam   [place], Charlane Ferretti, MD   3 months ago Type 2 diabetes mellitus without complication, without long-term current use of insulin ([place])   [place], [place], MD   8 months ago Type 2 diabetes mellitus without complication, without long-term current use of insulin [facility])   [place], [place], [place]   1 year ago Type 2 diabetes mellitus without complication, without long-term current use of insulin ([place])   [place] [facility] Charlott Rakes, MD

## 2020-10-09 ENCOUNTER — Ambulatory Visit: Payer: Self-pay | Admitting: *Deleted
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
## 2021-01-16 ENCOUNTER — Ambulatory Visit: Payer: Medicaid Other | Attending: Family Medicine | Admitting: Family Medicine

## 2021-01-16 ENCOUNTER — Other Ambulatory Visit: Payer: Self-pay

## 2021-01-16 ENCOUNTER — Telehealth: Payer: Self-pay | Admitting: Family Medicine

## 2021-01-16 ENCOUNTER — Encounter: Payer: Self-pay | Admitting: Family Medicine

## 2021-01-16 DIAGNOSIS — E785 Hyperlipidemia, unspecified: Secondary | ICD-10-CM

## 2021-01-16 DIAGNOSIS — E119 Type 2 diabetes mellitus without complications: Secondary | ICD-10-CM

## 2021-01-16 DIAGNOSIS — M940 Chondrocostal junction syndrome [Tietze]: Secondary | ICD-10-CM | POA: Diagnosis not present

## 2021-01-16 DIAGNOSIS — E042 Nontoxic multinodular goiter: Secondary | ICD-10-CM | POA: Diagnosis not present

## 2021-01-16 DIAGNOSIS — E1169 Type 2 diabetes mellitus with other specified complication: Secondary | ICD-10-CM | POA: Diagnosis not present

## 2021-01-16 MED ORDER — DICLOFENAC SODIUM 1 % EX GEL: 2.0000 g | Freq: Four times a day (QID) | CUTANEOUS | 1 refills | Status: DC

## 2021-01-16 MED ORDER — METFORMIN HCL 500 MG PO TABS: ORAL_TABLET | ORAL | 6 refills | Status: DC

## 2021-01-16 MED ORDER — ATORVASTATIN CALCIUM 40 MG PO TABS: 40.0000 mg | ORAL_TABLET | Freq: Every day | ORAL | 6 refills | Status: DC

## 2021-01-16 NOTE — Telephone Encounter (Signed)
Called patient to confirm her lab appt and she states that she has been taking Atorvastatin 20mg  and has been prescribed 40mg . Patient wants to know if the 40mg  is correct since she has not had her labs done.

## 2021-01-16 NOTE — Progress Notes (Signed)
Virtual Visit via Telephone Note  I connected with Debbie Lawson, on 01/16/2021 at 11:14 AM by telephone due to the COVID-19 pandemic and verified that I am speaking with the correct person using two identifiers.   Consent: I discussed the limitations, risks, security and privacy concerns of performing an evaluation and management service by telephone and the availability of in person appointments. I also discussed with the patient that there may be a patient responsible charge related to this service. The patient expressed understanding and agreed to proceed.   Location of Patient: Home  Location of Provider: Clinic   Persons participating in Telemedicine visit: Debbie Lawson - ID# [id number] Dr. 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 Last seen by ENT at [facility] on 11/2018 due to multiple thyroid nodules and FNA biopsy with suspicion for malignancy at a 50% chance and surgery planned however the patient opted to hold surgery until the pandemic is over.  She has had discomfort of her R arm radiating to her armpit for a while and feels like she has a boil. This is rated as a 5/10 but increases at night and she states 'she has inflammation in her armpit and R chest wall.' Screening mammogram and diagnostic mammogram of left breast were both negative for malignancy in 04/2020.   Compliant with metformin for diabetes and is doing well on her statin. Past Medical History:  Diagnosis Date   Hyperlipidemia 04/15/2017   No Known Allergies  Current Outpatient Medications on File Prior to Visit  Medication Sig Dispense Refill   Accu-Chek Softclix Lancets lancets USE AS DIRECTED 100 each 2   atorvastatin (LIPITOR) 40 MG tablet Take 1 tablet (40 mg total)  by mouth daily. 30 tablet 6   Blood Glucose Monitoring Suppl (ACCU-CHEK AVIVA) device Use as instructed daily. 1 each 0   Blood Glucose Monitoring Suppl (ACCU-CHEK GUIDE ME) w/Device KIT Use to check blood sugar one daily. E11.9 1 kit 0   Cholecalciferol (VITAMIN D PO) Take 1 tablet by mouth daily.     clotrimazole (LOTRIMIN) 1 % cream Apply 1 application topically 2 (two) times daily. (Patient not taking: Reported on 01/19/2018) 45 g 1   glucose blood test strip USE 1 STRIP TO CHECK GLUCOSE ONCE DAILY BEFORE BREAKFAST 100 strip 3   ibuprofen (ADVIL) 800 MG tablet TAKE 1 TABLET BY MOUTH THREE TIMES DAILY AS NEEDED FOR PAIN 60 tablet 0   metFORMIN (GLUCOPHAGE) 500 MG tablet TAKE 2 TABLETs BY MOUTH TWICE DAILY WITH A MEAL 120 tablet 6   terbinafine (LAMISIL) 250 MG tablet Take 1 tablet (250 mg total) by mouth daily. 30 tablet 2   No current facility-administered medications on file prior to visit.    ROS: See HPI  Observations/Objective: Awake, alert, oriented x3 Not in acute distress Normal mood  CMP Latest Ref Rng & Units 05/09/2020 09/22/2019 05/13/2019  Glucose 65 - 99 mg/dL 125(H) 163(H) 113(H)  BUN 6 - 24 mg/dL 6 13 5(L)  Creatinine 0.57 - 1.00 mg/dL 0.57 0.60 0.69  Sodium 134 - 144 mmol/L 137 144 140  Potassium 3.5 - 5.2 mmol/L 4.1 4.5 4.0  Chloride 96 - 106 mmol/L 102 105 101  CO2 20 - 29 mmol/L 23 17(L) 25  Calcium 8.7 - 10.2 mg/dL  9.4 10.0 9.1  Total Protein 6.0 - 8.5 g/dL 7.5 - 6.8  Total Bilirubin 0.0 - 1.2 mg/dL 0.4 - 0.5  Alkaline Phos 44 - 121 IU/L 66 - 74  AST 0 - 40 IU/L 18 - 23  ALT 0 - 32 IU/L 17 - 21    Lab Results  Component Value Date   HGBA1C 6.1 02/07/2020    Assessment and Plan: 1. Type 2 diabetes mellitus without complication, without long-term current use of insulin (HCC) Controlled with A1c of 6.1 Continue current regimen - CMP14+EGFR; Future - Lipid panel; Future - Hemoglobin A1c; Future - metFORMIN (GLUCOPHAGE) 500 MG tablet; TAKE 2 TABLETs BY  MOUTH TWICE DAILY WITH A MEAL  Dispense: 120 tablet; Refill: 6 - Microalbumin / creatinine urine ratio; Future  2. Hyperlipidemia associated with type 2 diabetes mellitus (HCC) Controlled Low-cholesterol diet - atorvastatin (LIPITOR) 40 MG tablet; Take 1 tablet (40 mg total) by mouth daily.  Dispense: 30 tablet; Refill: 6  3. Costochondritis Uncontrolled She currently does not have a furuncle in her right axilla We will place on topical NSAID Mammogram was less than a year ago and negative for malignancy - diclofenac Sodium (VOLTAREN) 1 % GEL; Apply 2 g topically 4 (four) times daily.  Dispense: 100 g; Refill: 1  4. Multinodular thyroid Discussed findings of previous pathology with the patient and she has been strongly encouraged to set up a follow-up appointment with her ENT at [facility].  Follow Up Instructions: 3 months   I discussed the assessment and treatment plan with the patient. The patient was provided an opportunity to ask questions and all were answered. The patient agreed with the plan and demonstrated an understanding of the instructions.   The patient was advised to call back or seek an in-person evaluation if the symptoms worsen or if the condition fails to improve as anticipated.     I provided 21 minutes total of non-face-to-face time during this encounter.   Charlott Rakes, MD, FAAFP. [facility] and [place], [place]   01/16/2021, 11:14 AM

## 2021-01-19 ENCOUNTER — Ambulatory Visit: Payer: Medicaid Other | Attending: Family Medicine

## 2021-01-19 ENCOUNTER — Other Ambulatory Visit: Payer: Self-pay

## 2021-01-19 DIAGNOSIS — E119 Type 2 diabetes mellitus without complications: Secondary | ICD-10-CM

## 2021-01-20 LAB — CMP14+EGFR
ALT: 19 IU/L (ref 0–32)
AST: 19 IU/L (ref 0–40)
Albumin/Globulin Ratio: 1.8 (ref 1.2–2.2)
Albumin: 4.4 g/dL (ref 3.8–4.8)
Alkaline Phosphatase: 68 IU/L (ref 44–121)
BUN/Creatinine Ratio: 14 (ref 9–23)
BUN: 9 mg/dL (ref 6–24)
Bilirubin Total: 0.2 mg/dL (ref 0.0–1.2)
CO2: 22 mmol/L (ref 20–29)
Calcium: 9.4 mg/dL (ref 8.7–10.2)
Chloride: 102 mmol/L (ref 96–106)
Creatinine, Ser: 0.65 mg/dL (ref 0.57–1.00)
Globulin, Total: 2.5 g/dL (ref 1.5–4.5)
Glucose: 97 mg/dL (ref 65–99)
Potassium: 4.2 mmol/L (ref 3.5–5.2)
Sodium: 139 mmol/L (ref 134–144)
Total Protein: 6.9 g/dL (ref 6.0–8.5)
eGFR: 107 mL/min/{1.73_m2} (ref >59)

## 2021-01-20 LAB — MICROALBUMIN / CREATININE URINE RATIO
Creatinine, Urine: 160.5 mg/dL
Microalb/Creat Ratio: 6 mg/g creat (ref 0–29)
Microalbumin, Urine: 9.7 ug/mL

## 2021-01-20 LAB — LIPID PANEL
Chol/HDL Ratio: 3.3 ratio (ref 0.0–4.4)
Cholesterol, Total: 114 mg/dL (ref 100–199)
HDL: 35 mg/dL — ABNORMAL LOW (ref >39)
LDL Chol Calc (NIH): 51 mg/dL (ref 0–99)
Triglycerides: 163 mg/dL — ABNORMAL HIGH (ref 0–149)
VLDL Cholesterol Cal: 28 mg/dL (ref 5–40)

## 2021-01-20 LAB — HEMOGLOBIN A1C
Est. average glucose Bld gHb Est-mCnc: 146 mg/dL
Hgb A1c MFr Bld: 6.7 % — ABNORMAL HIGH (ref 4.8–5.6)

## 2021-01-23 ENCOUNTER — Telehealth: Payer: Self-pay

## 2021-01-23 NOTE — Telephone Encounter (Signed)
-----   Message from Charlott Rakes, MD sent at 01/23/2021  1:20 PM EDT ----- Please inform her that A1c is normal at 6.7, kidney and liver functions are normal.  Cholesterol is also controlled.  Please continue medications at current doses and atorvastatin at 40 mg.

## 2021-01-23 NOTE — Telephone Encounter (Signed)
Patient name and DOB has been verified Patient was informed of lab results. Patient had no questions.  

## 2021-01-23 NOTE — Telephone Encounter (Signed)
She was supposed to be on 40 mg of atorvastatin and needs to continue 40 mg

## 2021-01-23 NOTE — Telephone Encounter (Signed)
Patient just recently had lab done on 01/19/2021 and is wanting to know which dosage of medication she should be taking.

## 2021-01-23 NOTE — Telephone Encounter (Signed)
Patient was called and informed of medication dosage.

## 2021-03-29 IMAGING — MG DIGITAL SCREENING BILAT W/ CAD
6 series · 6 of 6 positions shown · non-contrast
Comparison: Previous exam(s).

CLINICAL DATA: Screening.

EXAM:
DIGITAL SCREENING BILATERAL MAMMOGRAM WITH CAD

[L MLO]
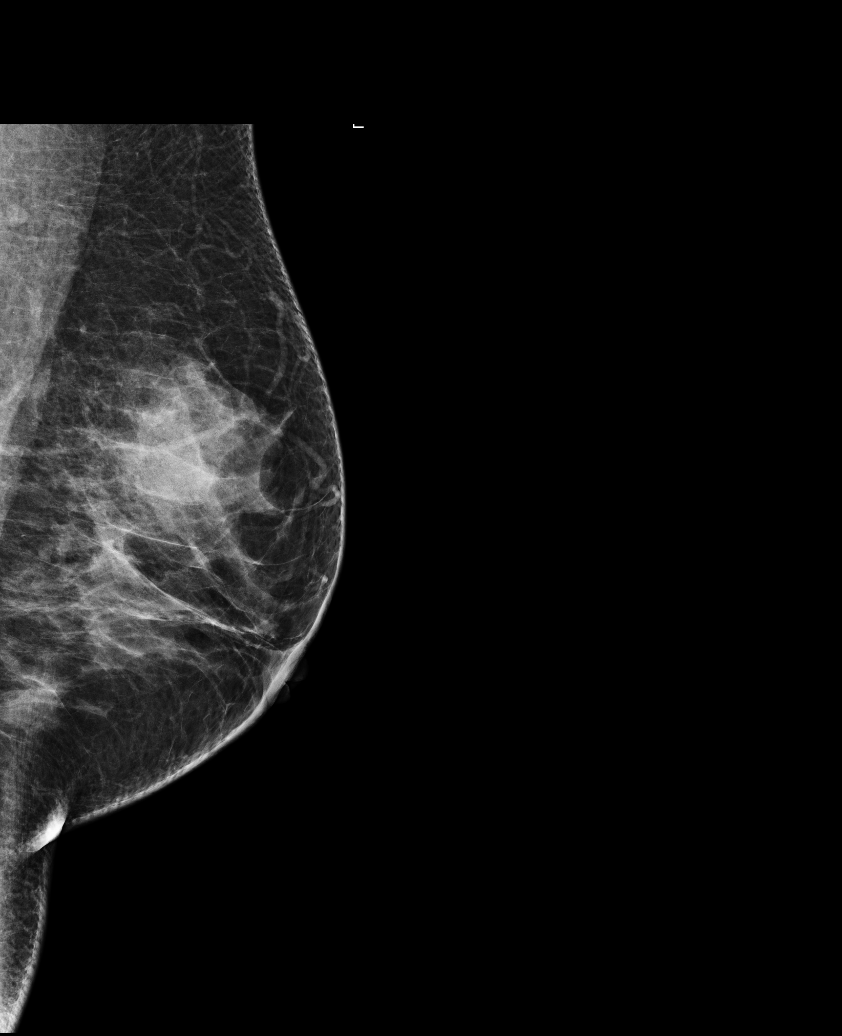

[L CC (1 of 2)]
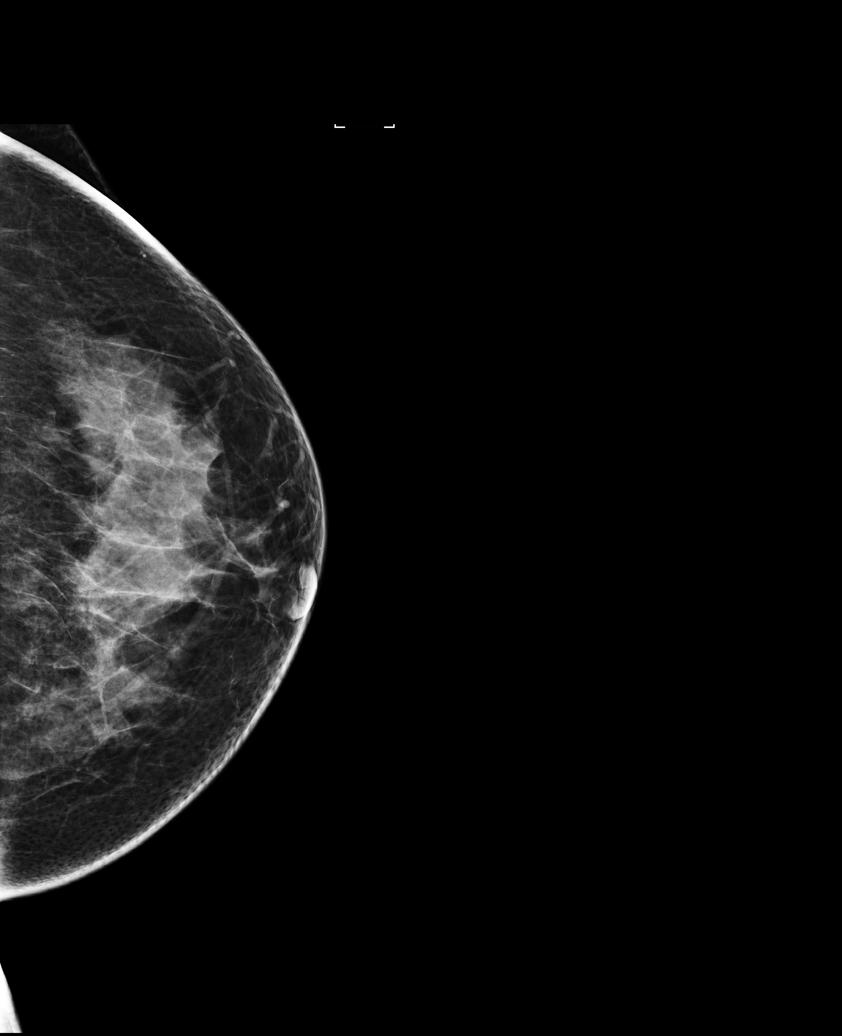

[R CC (1 of 2)]
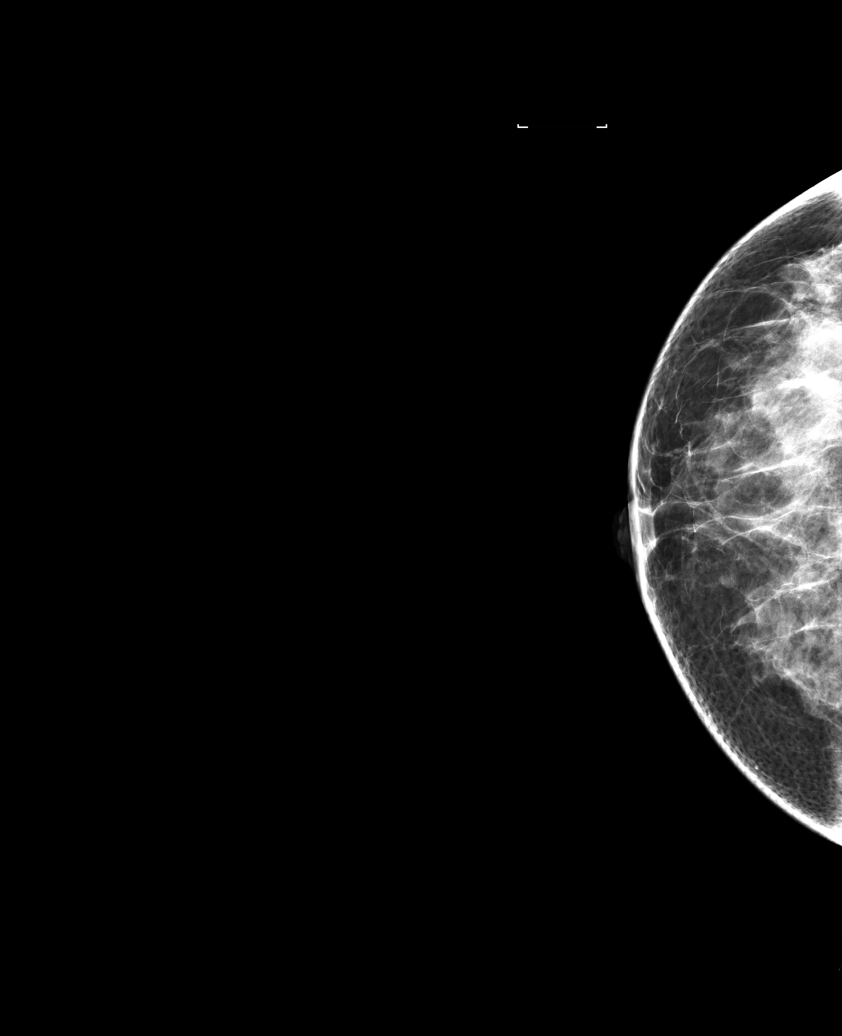

[R MLO]
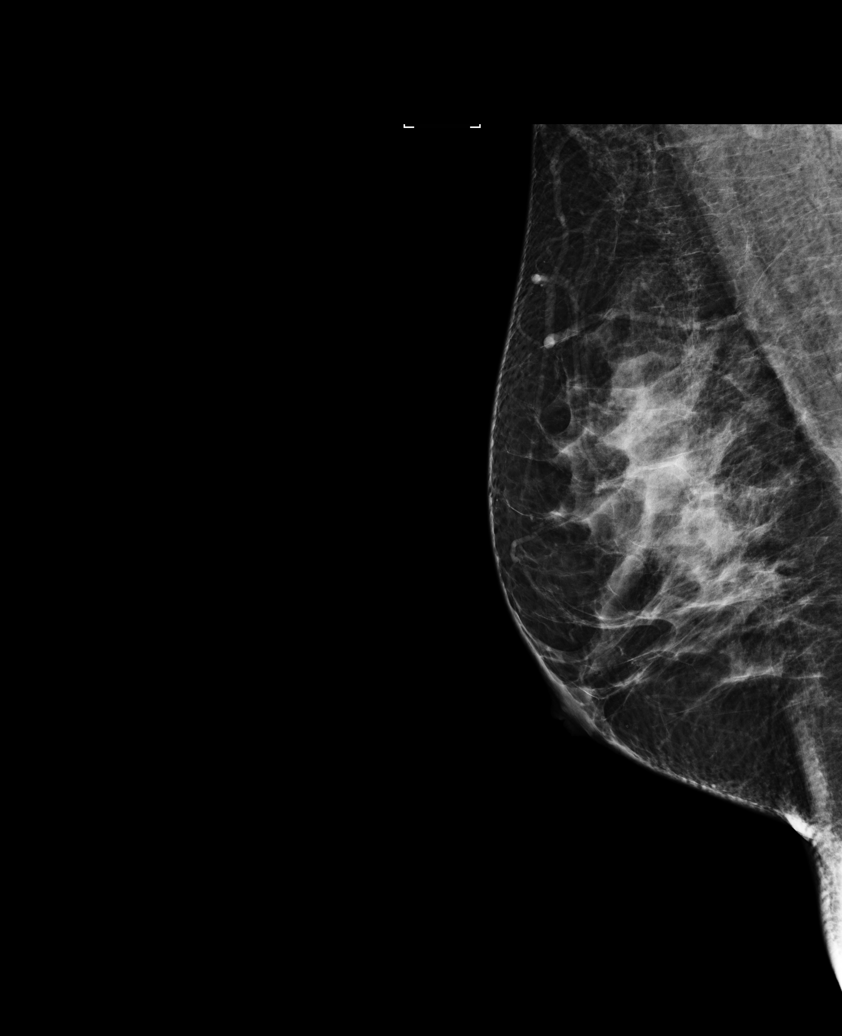

[R CC (2 of 2)]
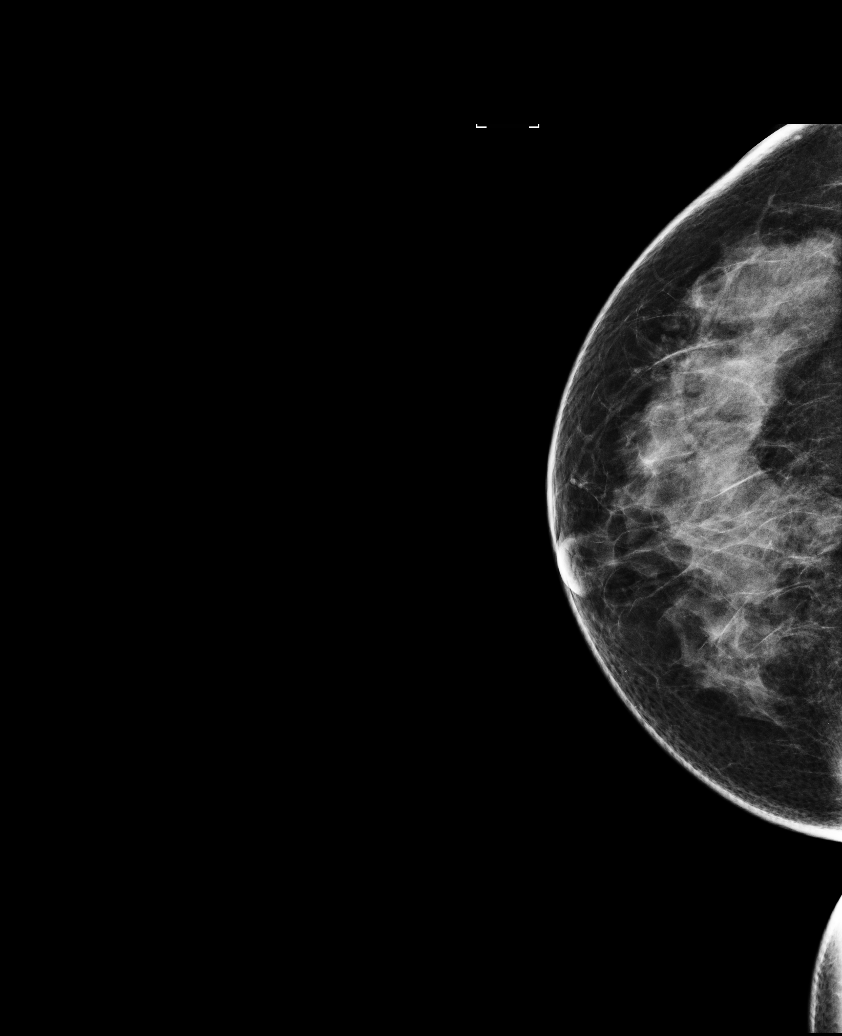

[L CC (2 of 2)]
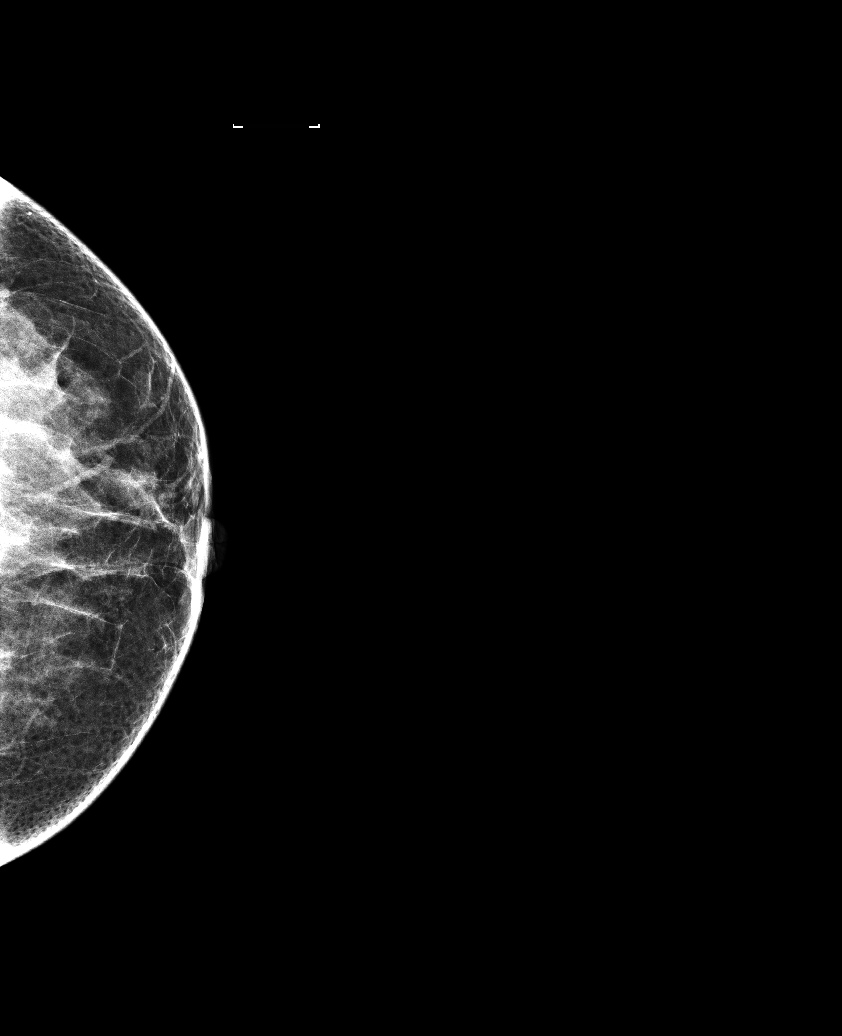

[6 of 6 positions shown; findings below may reference images not displayed]

ACR Breast Density Category c: The breast tissue is heterogeneously
dense, which may obscure small masses.
FINDINGS: In the left breast, a possible asymmetry warrants further
evaluation. In the right breast, no findings suspicious for
malignancy. Images were processed with CAD.
IMPRESSION: Further evaluation is suggested for possible asymmetry in the left
breast.

RECOMMENDATION:
Diagnostic mammogram and possibly ultrasound of the left breast.
(Code:[id number]-X-WW1)

The patient will be contacted regarding the findings, and additional
imaging will be scheduled.

BI-RADS CATEGORY  0: Incomplete. Need additional imaging evaluation
and/or prior mammograms for comparison.

## 2021-06-03 ENCOUNTER — Other Ambulatory Visit: Payer: Self-pay | Admitting: Family Medicine

## 2021-06-03 DIAGNOSIS — G8929 Other chronic pain: Secondary | ICD-10-CM

## 2021-07-04 ENCOUNTER — Ambulatory Visit: Payer: Medicaid Other | Attending: Family Medicine | Admitting: Family Medicine

## 2021-07-04 ENCOUNTER — Encounter: Payer: Self-pay | Admitting: Family Medicine

## 2021-07-04 ENCOUNTER — Other Ambulatory Visit: Payer: Self-pay

## 2021-07-04 VITALS — BP 127/81 | HR 69 | Ht 61.0 in | Wt 149.0 lb

## 2021-07-04 DIAGNOSIS — Z23 Encounter for immunization: Secondary | ICD-10-CM | POA: Diagnosis not present

## 2021-07-04 DIAGNOSIS — Z1211 Encounter for screening for malignant neoplasm of colon: Secondary | ICD-10-CM

## 2021-07-04 DIAGNOSIS — E785 Hyperlipidemia, unspecified: Secondary | ICD-10-CM | POA: Diagnosis not present

## 2021-07-04 DIAGNOSIS — E042 Nontoxic multinodular goiter: Secondary | ICD-10-CM

## 2021-07-04 DIAGNOSIS — E1142 Type 2 diabetes mellitus with diabetic polyneuropathy: Secondary | ICD-10-CM | POA: Diagnosis not present

## 2021-07-04 DIAGNOSIS — E1169 Type 2 diabetes mellitus with other specified complication: Secondary | ICD-10-CM

## 2021-07-04 DIAGNOSIS — E119 Type 2 diabetes mellitus without complications: Secondary | ICD-10-CM | POA: Diagnosis not present

## 2021-07-04 LAB — POCT GLYCOSYLATED HEMOGLOBIN (HGB A1C): HbA1c, POC (controlled diabetic range): 6.4 % (ref 0.0–7.0)

## 2021-07-04 LAB — GLUCOSE, POCT (MANUAL RESULT ENTRY): POC Glucose: 111 mg/dl — AB (ref 70–99)

## 2021-07-04 MED ORDER — GABAPENTIN 300 MG PO CAPS: 300.0000 mg | ORAL_CAPSULE | Freq: Every day | ORAL | 3 refills | Status: DC

## 2021-07-04 MED ORDER — METFORMIN HCL 500 MG PO TABS: ORAL_TABLET | ORAL | 6 refills | Status: DC

## 2021-07-04 MED ORDER — ATORVASTATIN CALCIUM 40 MG PO TABS: 40.0000 mg | ORAL_TABLET | Freq: Every day | ORAL | 6 refills | Status: DC

## 2021-07-04 NOTE — Patient Instructions (Signed)
Hacer ejercicio para mantenerse sano Exercising to Stay Healthy Para estar sano y [place] as, es recomendable hacer ejercicio de intensidad moderada y de intensidad Minor. Puede saber si est haciendo ejercicio de intensidad moderada si su corazn comienza a latir ms rpido y su respiracin se vuelve ms rpida, pero an [place]. Puede saber que est haciendo ejercicio de intensidad vigorosa si respira con mucha ms dificultad y rapidez, y no puede Theatre manager una conversacin. Cmo puede beneficiarme el ejercicio? Hacer actividad fsica con regularidad es muy importante. Tiene muchos otros beneficios, como por ejemplo: Mejora el estado fsico general, la flexibilidad y la resistencia. Aumenta la densidad sea. Ayuda a Technical sales engineer. Disminuye la Air traffic controller. Aumenta la fuerza y la resistencia muscular. Reduce el estrs y la tensin, la ansiedad, la depresin o la ira. Mejora el estado de salud general. Qu pautas debo seguir mientras hago ejercicio? Hable con el mdico antes de comenzar un programa nuevo de [place] fsica. No haga ejercicio en exceso que pudiera hacer que se lastime, se sienta mareado o tenga dificultad para respirar. Use ropa cmoda y calzado con buen soporte. Beba gran cantidad de agua mientras hace ejercicio para evitar la deshidratacin o los golpes de Freight forwarder. Haga ejercicio hasta que se aceleren su respiracin y sus latidos cardacos (intensidad Spencer). Con qu frecuencia debera hacer ejercicio? Elija una actividad que disfrute y establezca objetivos realistas. El mdico puede ayudarlo a Paediatric nurse un plan de actividades, diseado de [place] individual y que funcione mejor para usted. Haga actividad fsica habitualmente como se lo haya indicado el mdico. Esto puede incluir: Hacer ejercicios de fortalecimiento muscular dos veces a la semana, como: Levantamiento de pesas. Uso de bandas elsticas de resistencia. Flexiones de  [employer]. Abdominales. Yoga. Realizar ejercicio de [place] cierta intensidad durante una cantidad determinada de [place]. Elija entre estas opciones: Un total de 150 minutos de ejercicio de intensidad moderada cada semana. Un total de 75 minutos de ejercicio de intensidad vigorosa cada semana. [employer] de ejercicio de intensidad moderada y vigorosa cada semana. Los nios, las mujeres [place], las personas que no han hecho actividad fsica con regularidad, las personas que tienen sobrepeso y los adultos mayores tal vez tengan que consultar a un mdico sobre qu actividades son seguras para Optometrist. Si tiene Press photographer, asegrese de Teacher, adult education al mdico antes de comenzar un programa de ejercicios nuevo. Cules son algunas ideas para hacer ejercicio? Algunas ideas de ejercicio de intensidad moderada incluyen: Caminar 1 milla (1.6 km) en aproximadamente 15 minutos. Andar en bicicleta. Practicar senderismo. Jugar al golf. Bailar. Gimnasia acutica. Algunas ideas de ejercicio de intensidad vigorosa incluyen: Caminar 4.5 millas (7.2 km) o ms en aproximadamente una hora. Trotar o correr 5 millas (8 km) en aproximadamente una hora. Andar en bicicleta 10 millas (16.1 km) o ms en aproximadamente una hora. Practicar natacin. Practicar patinaje de ruedas o en lnea. Hacer esqu de fondo. Hacer deportes competitivos vigorosos, como ftbol americano, bsquet y ftbol. Saltar la cuerda. Tomar clases de baile Lucinda Dell son algunas actividades diarias que pueden ayudarme a hacer ejercicio? Trabajar en el jardn, como: Empujar una cortadora de csped. Juntar y embolsar hojas. Lavar el automvil. Empujar un cochecito. [place] nieve. [place] Lavar las ventanas o los pisos. Cmo puedo ser ms activo en mis actividades diarias? Utilice las Clinical cytogeneticist del ascensor. Vaya a caminar durante su hora de almuerzo. Si conduce, estacione el automvil ms lejos del trabajo o de  la escuela. Si [place] transporte  pblico, bjese una parada antes y camine el resto del camino. Pngase de pie o camine durante todas las llamadas telefnicas que haga mientras est adentro. Levntese, estrese y camine cada 30 minutos a lo largo del Training and development officer. Haga ejercicio con [place]aff[place]. El apoyo para continuar haciendo ejercicio lo ayudar a Theatre manager una rutina de actividad frecuente. Dnde obtener ms informacin Puede obtener ms informacin acerca de cmo hacer actividad fsica para mantenerse sano en: U.S. Department of Health and [employer] ([employer] [place]): BondedCompany.at Centers for Disease Control and Prevention Librarian, academic) (Centros para Building surveyor y la Prevencin de Arboriculturist): http://www.wolf.info/ Resumen Hacer actividad fsica con regularidad es muy importante. Mejorar el estado fsico general, la flexibilidad y la resistencia. El ejercicio regular tambin mejorar la salud general. Merilynn [place] a controlar su peso, reducir el estrs y Teacher, English as a foreign language la densidad sea. No haga ejercicio en exceso que pudiera hacer que se lastime, se sienta mareado o tenga dificultad para respirar. Hable con el mdico antes de comenzar un programa nuevo de [place] fsica. Esta informacin no tiene Marine scientist el consejo del mdico. Asegrese de hacerle al mdico cualquier pregunta que tenga. Document Revised: 11/16/2020 Document Reviewed: 11/16/2020 Elsevier Patient Education  2022 [employer].

## 2021-07-04 NOTE — Progress Notes (Signed)
Subjective:  Patient ID: Debbie Lawson, female    DOB: May 08, 1971  Age: 50 y.o. MRN: [id number]  CC: Diabetes   HPI Arriyana Rodell is a 50 y.o. 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 She has not followed up with ENT despite being encouraged to do so on several occassions.  Interval History: She sometimes has sharp pains in her legs which are intermittent and has been present for the last couple of months. With regards to her diabetes mellitus she denies planes of hypoglycemia or visual concerns.  She is not up-to-date on annual eye exam. Compliant with her statin and has no adverse effects from her medication. She has no additional concerns. Past Medical History:  Diagnosis Date   Hyperlipidemia 04/15/2017    Past Surgical History:  Procedure Laterality Date   NO PAST SURGERIES      Family History  Problem Relation Age of Onset   Diabetes Mother     No Known Allergies  Outpatient Medications Prior to Visit  Medication Sig Dispense Refill   Accu-Chek Softclix Lancets lancets USE AS DIRECTED 100 each 2   Blood Glucose Monitoring Suppl (ACCU-CHEK AVIVA) device Use as instructed daily. 1 each 0   Blood Glucose Monitoring Suppl (ACCU-CHEK GUIDE ME) w/Device KIT Use to check blood sugar one daily. E11.9 1 kit 0   Cholecalciferol (VITAMIN D PO) Take 1 tablet by mouth daily.     clotrimazole (LOTRIMIN) 1 % cream Apply 1 application topically 2 (two) times daily. 45 g 1   diclofenac Sodium (VOLTAREN) 1 % GEL Apply 2 g topically 4 (four) times daily. 100 g 1   glucose blood test strip USE 1 STRIP TO CHECK GLUCOSE ONCE DAILY BEFORE BREAKFAST 100 strip 3   ibuprofen (ADVIL) 800 MG tablet TAKE 1 TABLET BY MOUTH THREE TIMES DAILY AS NEEDED FOR PAIN 60 tablet 0   terbinafine (LAMISIL) 250 MG tablet Take 1 tablet (250 mg total) by mouth daily. 30 tablet 2    atorvastatin (LIPITOR) 40 MG tablet Take 1 tablet (40 mg total) by mouth daily. 30 tablet 6   metFORMIN (GLUCOPHAGE) 500 MG tablet TAKE 2 TABLETs BY MOUTH TWICE DAILY WITH A MEAL 120 tablet 6   No facility-administered medications prior to visit.     ROS Review of Systems  Constitutional:  Negative for activity change, appetite change and fatigue.  HENT:  Negative for congestion, sinus pressure and sore throat.   Eyes:  Negative for visual disturbance.  Respiratory:  Negative for cough, chest tightness, shortness of breath and wheezing.   Cardiovascular:  Negative for chest pain and palpitations.  Gastrointestinal:  Negative for abdominal distention, abdominal pain and constipation.  Endocrine: Negative for polydipsia.  Genitourinary:  Negative for dysuria and frequency.  Musculoskeletal:        See HPI  Skin:  Negative for rash.  Neurological:  Negative for tremors, light-headedness and numbness.  Hematological:  Does not bruise/bleed easily.  Psychiatric/Behavioral:  Negative for agitation and behavioral problems.    Objective:  BP 127/81    Pulse 69    Ht 5' 1"  (1.549 m)    Wt 149 lb (67.6 kg)    SpO2 100%    BMI 28.15 kg/m   BP/Weight 07/04/2021 05/09/2020 03/22/5175  Systolic BP 160 737 106  Diastolic BP 81 77 74  Wt. (Lbs) 149 157 159.8  BMI 28.15  29.66 30.19      Physical Exam Constitutional:      Appearance: She is well-developed.  Cardiovascular:     Rate and Rhythm: Normal rate.     Heart sounds: Normal heart sounds. No murmur heard. Pulmonary:     Effort: Pulmonary effort is normal.     Breath sounds: Normal breath sounds. No wheezing or rales.  Chest:     Chest wall: No tenderness.  Abdominal:     General: Bowel sounds are normal. There is no distension.     Palpations: Abdomen is soft. There is no mass.     Tenderness: There is no abdominal tenderness.  Musculoskeletal:        General: Normal range of motion.     Right lower leg: No edema.      Left lower leg: No edema.  Neurological:     Mental Status: She is alert and oriented to person, place, and time.  Psychiatric:        Mood and Affect: Mood normal.   Diabetic Foot Exam - Simple   Simple Foot Form Diabetic Foot exam was performed with the following findings: Yes 07/04/2021 10:12 AM  Visual Inspection No deformities, no ulcerations, no other skin breakdown bilaterally: Yes Sensation Testing Intact to touch and monofilament testing bilaterally: Yes Pulse Check Posterior Tibialis and Dorsalis pulse intact bilaterally: Yes Comments      . CMP Latest Ref Rng & Units 01/19/2021 05/09/2020 09/22/2019  Glucose 65 - 99 mg/dL 97 125(H) 163(H)  BUN 6 - 24 mg/dL 9 6 13   Creatinine 0.57 - 1.00 mg/dL 0.65 0.57 0.60  Sodium 134 - 144 mmol/L 139 137 144  Potassium 3.5 - 5.2 mmol/L 4.2 4.1 4.5  Chloride 96 - 106 mmol/L 102 102 105  CO2 20 - 29 mmol/L 22 23 17(L)  Calcium 8.7 - 10.2 mg/dL 9.4 9.4 10.0  Total Protein 6.0 - 8.5 g/dL 6.9 7.5 -  Total Bilirubin 0.0 - 1.2 mg/dL 0.2 0.4 -  Alkaline Phos 44 - 121 IU/L 68 66 -  AST 0 - 40 IU/L 19 18 -  ALT 0 - 32 IU/L 19 17 -    Lipid Panel     Component Value Date/Time   CHOL 114 01/19/2021 0950   TRIG 163 (H) 01/19/2021 0950   HDL 35 (L) 01/19/2021 0950   CHOLHDL 3.3 01/19/2021 0950   CHOLHDL 5.4 (H) 08/12/2016 0932   VLDL 27 07/11/2010 2115   LDLCALC 51 01/19/2021 0950    CBC    Component Value Date/Time   WBC 6.2 08/12/2016 0932   RBC 4.87 08/12/2016 0932   HGB 15.1 08/12/2016 0932   HCT 44.6 08/12/2016 0932   PLT 331 08/12/2016 0932   MCV 91.6 08/12/2016 0932   MCH 31.0 08/12/2016 0932   MCHC 33.9 08/12/2016 0932   RDW 12.9 08/12/2016 0932   LYMPHSABS 2,170 08/12/2016 0932   MONOABS 558 08/12/2016 0932   EOSABS 124 08/12/2016 0932   BASOSABS 0 08/12/2016 0932    Lab Results  Component Value Date   HGBA1C 6.4 07/04/2021    Assessment & Plan:  1. Type 2 diabetes mellitus with diabetic polyneuropathy,  without long-term current use of insulin (HCC) Controlled with A1c of 6.4 Continue current management Counseled on Diabetic diet, my plate method, 729 minutes of moderate intensity exercise/week Blood sugar logs with fasting goals of 80-120 mg/dl, random of less than 180 and in the event of sugars less than 60 mg/dl or greater  than 400 mg/dl encouraged to notify the clinic. Advised on the need for annual eye exams, annual foot exams, Pneumonia vaccine.  - POCT glucose (manual entry) - POCT glycosylated hemoglobin (Hb A1C) - Ambulatory referral to Ophthalmology - metFORMIN (GLUCOPHAGE) 500 MG tablet; TAKE 2 TABLETs BY MOUTH TWICE DAILY WITH A MEAL  Dispense: 120 tablet; Refill: 6 - CMP14+EGFR - LP+Non-HDL Cholesterol  2. Screening for colon cancer - Ambulatory referral to Gastroenterology  3. Hyperlipidemia associated with type 2 diabetes mellitus ([place]) LDL at goal but slight hypertriglyceridemia from labs from 01/2021 Continue statin Low-cholesterol diet - atorvastatin (LIPITOR) 40 MG tablet; Take 1 tablet (40 mg total) by mouth daily.  Dispense: 30 tablet; Refill: 6  4. Multinodular thyroid Pathology was suspicious for malignancy in 2018 She has been lost to follow-up despite encouragement to schedule appointment I will place referral again - Ambulatory referral to ENT  5. Need for pneumococcal vaccine - Pneumococcal conjugate vaccine 20-valent  6. Need for immunization against influenza - Flu Vaccine QUAD 39moIM (Fluarix, Fluzone & Alfiuria Quad PF)    Meds ordered this encounter  Medications   gabapentin (NEURONTIN) 300 MG capsule    Sig: Take 1 capsule (300 mg total) by mouth at bedtime.    Dispense:  30 capsule    Refill:  3   atorvastatin (LIPITOR) 40 MG tablet    Sig: Take 1 tablet (40 mg total) by mouth daily.    Dispense:  30 tablet    Refill:  6    Dose increase   metFORMIN (GLUCOPHAGE) 500 MG tablet    Sig: TAKE 2 TABLETs BY MOUTH TWICE DAILY WITH A MEAL     Dispense:  120 tablet    Refill:  6    Please consider 90 day supplies to promote better adherence    Follow-up: Return in about 6 months (around 01/02/2022) for Chronic medical conditions.       ECharlott Rakes MD, FAAFP. [facility]  07/04/2021, 1:18 PM

## 2021-07-05 LAB — CMP14+EGFR
ALT: 11 IU/L (ref 0–32)
AST: 14 IU/L (ref 0–40)
Albumin/Globulin Ratio: 1.8 (ref 1.2–2.2)
Albumin: 4.7 g/dL (ref 3.8–4.9)
Alkaline Phosphatase: 58 IU/L (ref 44–121)
BUN/Creatinine Ratio: 14 (ref 9–23)
BUN: 9 mg/dL (ref 6–24)
Bilirubin Total: 0.6 mg/dL (ref 0.0–1.2)
CO2: 22 mmol/L (ref 20–29)
Calcium: 9.4 mg/dL (ref 8.7–10.2)
Chloride: 102 mmol/L (ref 96–106)
Creatinine, Ser: 0.64 mg/dL (ref 0.57–1.00)
Globulin, Total: 2.6 g/dL (ref 1.5–4.5)
Glucose: 106 mg/dL — ABNORMAL HIGH (ref 70–99)
Potassium: 4.3 mmol/L (ref 3.5–5.2)
Sodium: 139 mmol/L (ref 134–144)
Total Protein: 7.3 g/dL (ref 6.0–8.5)
eGFR: 107 mL/min/{1.73_m2} (ref >59)

## 2021-07-05 LAB — LP+NON-HDL CHOLESTEROL
Cholesterol, Total: 98 mg/dL — ABNORMAL LOW (ref 100–199)
HDL: 36 mg/dL — ABNORMAL LOW (ref >39)
LDL Chol Calc (NIH): 38 mg/dL (ref 0–99)
Total Non-HDL-Chol (LDL+VLDL): 62 mg/dL (ref 0–129)
Triglycerides: 138 mg/dL (ref 0–149)
VLDL Cholesterol Cal: 24 mg/dL (ref 5–40)

## 2021-07-06 ENCOUNTER — Telehealth: Payer: Self-pay

## 2021-07-06 NOTE — Telephone Encounter (Signed)
-----   Message from Charlott Rakes, MD sent at 07/05/2021  1:18 PM EST ----- Please inform the patient that labs are normal. Thank you.

## 2021-07-06 NOTE — Telephone Encounter (Signed)
Patient name and DOB has been verified Patient was informed of lab results. Patient had no questions.  

## 2021-12-13 ENCOUNTER — Other Ambulatory Visit: Payer: Self-pay | Admitting: Family Medicine

## 2021-12-13 DIAGNOSIS — E119 Type 2 diabetes mellitus without complications: Secondary | ICD-10-CM

## 2022-01-24 ENCOUNTER — Encounter: Payer: Self-pay | Admitting: Family Medicine

## 2022-01-24 DIAGNOSIS — E119 Type 2 diabetes mellitus without complications: Secondary | ICD-10-CM | POA: Diagnosis not present

## 2022-01-24 DIAGNOSIS — H04123 Dry eye syndrome of bilateral lacrimal glands: Secondary | ICD-10-CM | POA: Diagnosis not present

## 2022-01-24 DIAGNOSIS — H2513 Age-related nuclear cataract, bilateral: Secondary | ICD-10-CM | POA: Diagnosis not present

## 2022-01-24 LAB — HM DIABETES EYE EXAM

## 2022-03-17 ENCOUNTER — Other Ambulatory Visit: Payer: Self-pay | Admitting: Family Medicine

## 2022-03-17 DIAGNOSIS — E1169 Type 2 diabetes mellitus with other specified complication: Secondary | ICD-10-CM

## 2022-03-18 NOTE — Telephone Encounter (Signed)
Requested Prescriptions  Pending Prescriptions Disp Refills  . atorvastatin (LIPITOR) 40 MG tablet [Pharmacy Med Name: Atorvastatin Calcium 40 MG Oral Tablet] 30 tablet 0    Sig: Take 1 tablet by mouth once daily     Cardiovascular:  Antilipid - Statins Failed - 03/17/2022 12:01 PM      Failed - Lipid Panel in normal range within the last 12 months    Cholesterol, Total  Date Value Ref Range Status  07/04/2021 98 (L) 100 - 199 mg/dL Final   LDL Chol Calc (NIH)  Date Value Ref Range Status  07/04/2021 38 0 - 99 mg/dL Final   HDL  Date Value Ref Range Status  07/04/2021 36 (L) >39 mg/dL Final   Triglycerides  Date Value Ref Range Status  07/04/2021 138 0 - 149 mg/dL Final         Passed - Patient is not pregnant      Passed - Valid encounter within last 12 months    Recent Outpatient Visits          8 months ago Type 2 diabetes mellitus with diabetic polyneuropathy, without long-term current use of insulin ([place])   [place], Charlane Ferretti, MD   1 year ago [place], Charlane Ferretti, MD   1 year ago Screening for cervical cancer   [place], Charlane Ferretti, MD   2 years ago Annual physical exam   [place], Charlane Ferretti, MD   2 years ago Type 2 diabetes mellitus without complication, without long-term current use of insulin ([place])   [place], [place], MD      Future Appointments            In 1 week Charlott Rakes, MD [place]

## 2022-03-26 ENCOUNTER — Ambulatory Visit: Payer: Medicaid Other | Attending: Family Medicine | Admitting: Family Medicine

## 2022-03-26 ENCOUNTER — Encounter: Payer: Self-pay | Admitting: Family Medicine

## 2022-03-26 ENCOUNTER — Other Ambulatory Visit: Payer: Self-pay | Admitting: Family Medicine

## 2022-03-26 VITALS — BP 125/76 | HR 75 | Temp 98.2°F | Ht 61.0 in | Wt 151.6 lb

## 2022-03-26 DIAGNOSIS — E042 Nontoxic multinodular goiter: Secondary | ICD-10-CM

## 2022-03-26 DIAGNOSIS — R011 Cardiac murmur, unspecified: Secondary | ICD-10-CM | POA: Diagnosis not present

## 2022-03-26 DIAGNOSIS — E1142 Type 2 diabetes mellitus with diabetic polyneuropathy: Secondary | ICD-10-CM | POA: Diagnosis not present

## 2022-03-26 DIAGNOSIS — E1169 Type 2 diabetes mellitus with other specified complication: Secondary | ICD-10-CM | POA: Diagnosis not present

## 2022-03-26 DIAGNOSIS — E785 Hyperlipidemia, unspecified: Secondary | ICD-10-CM

## 2022-03-26 DIAGNOSIS — N6459 Other signs and symptoms in breast: Secondary | ICD-10-CM | POA: Diagnosis not present

## 2022-03-26 DIAGNOSIS — Z1211 Encounter for screening for malignant neoplasm of colon: Secondary | ICD-10-CM

## 2022-03-26 LAB — POCT GLYCOSYLATED HEMOGLOBIN (HGB A1C): HbA1c, POC (controlled diabetic range): 6.9 % (ref 0.0–7.0)

## 2022-03-26 MED ORDER — ATORVASTATIN CALCIUM 40 MG PO TABS: 40.0000 mg | ORAL_TABLET | Freq: Every day | ORAL | 1 refills | Status: DC

## 2022-03-26 MED ORDER — GABAPENTIN 300 MG PO CAPS
300.0000 mg | ORAL_CAPSULE | Freq: Every day | ORAL | 3 refills | Status: DC
Start: 2022-03-26 — End: 2022-06-25

## 2022-03-26 MED ORDER — METFORMIN HCL 500 MG PO TABS: ORAL_TABLET | ORAL | 1 refills | Status: DC

## 2022-03-26 NOTE — Progress Notes (Signed)
Subjective:  Patient ID: Debbie Lawson, female    DOB: [birth year]  Age: 51 y.o. MRN: [id number]  CC: Breast Pain   HPI Debbie Lawson is a 51 y.o. 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 States it feels like she has something inside her right breast. Symptoms has been present for 6 months and has been intermittent without pain. She sometimes has right flank pain when she has inadequate water intake symptoms are absent at the moment.  Denies presence of dysuria or urinary frequency  A1c is 6.9 up from 6.4 previously.  She endorses adherence with metformin.  Denies hypoglycemia, numbness in extremities or visual concerns. I had referred her to ENT due to being lost to ENT follow-up given history of questionable thyroid neoplasm but she informs me when she called ENT office she was informed that the doctor no longer saw thyroid cases. Past Medical History:  Diagnosis Date   Hyperlipidemia 04/15/2017    Past Surgical History:  Procedure Laterality Date   NO PAST SURGERIES      Family History  Problem Relation Age of Onset   Diabetes Mother     Social History   Socioeconomic History   Marital status: Married    Spouse name: Not on file   Number of children: Not on file   Years of education: Not on file   Highest education level: Not on file  Occupational History   Not on file  Tobacco Use   Smoking status: Never   Smokeless tobacco: Never  Vaping Use   Vaping Use: Never used  Substance and Sexual Activity   Alcohol use: No   Drug use: No   Sexual activity: Yes  Other Topics Concern   Not on file  Social History Narrative   Not on file   Social Determinants of Health    Financial Resource Strain: Not on file  Food Insecurity: Not on file  Transportation Needs: Not on file  Physical Activity: Not on file  Stress: Not on file  Social Connections: Not on file    No Known Allergies  Outpatient Medications Prior to Visit  Medication Sig Dispense Refill   ACCU-CHEK GUIDE test strip USE 1 STRIP ONCE DAILY BEFORE BREAKFAST 100 each 0   Accu-Chek Softclix Lancets lancets USE AS DIRECTED 100 each 0   Blood Glucose Monitoring Suppl (ACCU-CHEK AVIVA) device Use as instructed daily. 1 each 0   Blood Glucose Monitoring Suppl (ACCU-CHEK GUIDE ME) w/Device KIT Use to check blood sugar one daily. E11.9 1 kit 0   Cholecalciferol (VITAMIN D PO) Take 1 tablet by mouth daily.     clotrimazole (LOTRIMIN) 1 % cream Apply 1 application topically 2 (two) times daily. 45 g 1   diclofenac Sodium (VOLTAREN) 1 % GEL Apply 2 g topically 4 (four) times daily. 100 g 1   ibuprofen (ADVIL) 800 MG tablet TAKE 1 TABLET BY MOUTH THREE TIMES DAILY AS NEEDED FOR PAIN 60 tablet 0   atorvastatin (LIPITOR) 40 MG tablet Take 1 tablet by mouth once daily 90 tablet 0   gabapentin (NEURONTIN) 300 MG capsule Take 1 capsule (300 mg total) by mouth at bedtime. 30 capsule 3   metFORMIN (GLUCOPHAGE) 500 MG tablet TAKE 2  TABLETs BY MOUTH TWICE DAILY WITH A MEAL 120 tablet 6   terbinafine (LAMISIL) 250 MG tablet Take 1 tablet (250 mg total) by mouth daily. 30 tablet 2   No facility-administered medications prior to visit.     ROS Review of Systems  Constitutional:  Negative for activity change and appetite change.  HENT:  Negative for sinus pressure and sore throat.   Respiratory:  Negative for chest tightness, shortness of breath and wheezing.   Cardiovascular:  Negative for chest pain and palpitations.  Gastrointestinal:  Negative for abdominal distention, abdominal pain and constipation.  Genitourinary: Negative.   Musculoskeletal: Negative.   Psychiatric/Behavioral:  Negative for  behavioral problems and dysphoric mood.     Objective:  BP 125/76   Pulse 75   Temp 98.2 F (36.8 C) (Oral)   Ht 5' 1"  (1.549 m)   Wt 151 lb 9.6 oz (68.8 kg)   SpO2 100%   BMI 28.64 kg/m      03/26/2022   11:37 AM 07/04/2021    9:29 AM 05/09/2020   10:46 AM  BP/Weight  Systolic BP 979 892 119  Diastolic BP 76 81 77  Wt. (Lbs) 151.6 149 157  BMI 28.64 kg/m2 28.15 kg/m2 29.66 kg/m2      Physical Exam Constitutional:      Appearance: She is well-developed.  Cardiovascular:     Rate and Rhythm: Normal rate.     Heart sounds: Murmur heard.  Pulmonary:     Effort: Pulmonary effort is normal.     Breath sounds: Normal breath sounds. No wheezing or rales.  Chest:     Chest wall: No tenderness.  Breasts:    Right: Tenderness present. No inverted nipple or mass.     Left: No inverted nipple, mass or tenderness.  Abdominal:     General: Bowel sounds are normal. There is no distension.     Palpations: Abdomen is soft. There is no mass.     Tenderness: There is no abdominal tenderness. There is no right CVA tenderness or left CVA tenderness.  Musculoskeletal:        General: Normal range of motion.     Right lower leg: No edema.     Left lower leg: No edema.  Neurological:     Mental Status: She is alert and oriented to person, place, and time.  Psychiatric:        Mood and Affect: Mood normal.        Latest Ref Rng & Units 07/04/2021   10:36 AM 01/19/2021    9:50 AM 05/09/2020   11:22 AM  CMP  Glucose 70 - 99 mg/dL 106  97  125   BUN 6 - 24 mg/dL 9  9  6    Creatinine 0.57 - 1.00 mg/dL 0.64  0.65  0.57   Sodium 134 - 144 mmol/L 139  139  137   Potassium 3.5 - 5.2 mmol/L 4.3  4.2  4.1   Chloride 96 - 106 mmol/L 102  102  102   CO2 20 - 29 mmol/L 22  22  23    Calcium 8.7 - 10.2 mg/dL 9.4  9.4  9.4   Total Protein 6.0 - 8.5 g/dL 7.3  6.9  7.5   Total Bilirubin 0.0 - 1.2 mg/dL 0.6  0.2  0.4   Alkaline Phos 44 - 121 IU/L 58  68  66   AST 0 - 40 IU/L 14  19  18     ALT 0 -  32 IU/L 11  19  17      Lipid Panel     Component Value Date/Time   CHOL 98 (L) 07/04/2021 1036   TRIG 138 07/04/2021 1036   HDL 36 (L) 07/04/2021 1036   CHOLHDL 3.3 01/19/2021 0950   CHOLHDL 5.4 (H) 08/12/2016 0932   VLDL 27 07/11/2010 2115   LDLCALC 38 07/04/2021 1036    CBC    Component Value Date/Time   WBC 6.2 08/12/2016 0932   RBC 4.87 08/12/2016 0932   HGB 15.1 08/12/2016 0932   HCT 44.6 08/12/2016 0932   PLT 331 08/12/2016 0932   MCV 91.6 08/12/2016 0932   MCH 31.0 08/12/2016 0932   MCHC 33.9 08/12/2016 0932   RDW 12.9 08/12/2016 0932   LYMPHSABS 2,170 08/12/2016 0932   MONOABS 558 08/12/2016 0932   EOSABS 124 08/12/2016 0932   BASOSABS 0 08/12/2016 0932    Lab Results  Component Value Date   HGBA1C 6.9 03/26/2022    Assessment & Plan:  1. Type 2 diabetes mellitus with diabetic polyneuropathy, without long-term current use of insulin (HCC) Controlled with A1c of 6.9 Counseled on Diabetic diet, my plate method, 322 minutes of moderate intensity exercise/week Blood sugar logs with fasting goals of 80-120 mg/dl, random of less than 180 and in the event of sugars less than 60 mg/dl or greater than 400 mg/dl encouraged to notify the clinic. Advised on the need for annual eye exams, annual foot exams, Pneumonia vaccine. - POCT glycosylated hemoglobin (Hb A1C) - CMP14+EGFR - LP+Non-HDL Cholesterol - metFORMIN (GLUCOPHAGE) 500 MG tablet; TAKE 2 TABLETs BY MOUTH TWICE DAILY WITH A MEAL  Dispense: 360 tablet; Refill: 1  2. Murmur New finding and patient is unsure of history of this We will reexamine at next visit and if still present we will proceed with work-up with echocardiogram  3. Hyperlipidemia associated with type 2 diabetes mellitus (HCC) Controlled Low-cholesterol diet - atorvastatin (LIPITOR) 40 MG tablet; Take 1 tablet (40 mg total) by mouth daily.  Dispense: 90 tablet; Refill: 1  4. Breast symptom - MM DIAG BREAST TOMO BILATERAL; Future -  US BREAST LTD UNI RIGHT INC AXILLA; Future  5. Screening for colon cancer - Ambulatory referral to Gastroenterology  6. Multinodular thyroid Previously being followed by ENT but was lost to follow-up Pathology was questionable for malignancy We will reach out to referral coordinator to expedite a referral   Meds ordered this encounter  Medications   atorvastatin (LIPITOR) 40 MG tablet    Sig: Take 1 tablet (40 mg total) by mouth daily.    Dispense:  90 tablet    Refill:  1   gabapentin (NEURONTIN) 300 MG capsule    Sig: Take 1 capsule (300 mg total) by mouth at bedtime.    Dispense:  30 capsule    Refill:  3   metFORMIN (GLUCOPHAGE) 500 MG tablet    Sig: TAKE 2 TABLETs BY MOUTH TWICE DAILY WITH A MEAL    Dispense:  360 tablet    Refill:  1    Please consider 90 day supplies to promote better adherence    Follow-up: Return in about 3 months (around 06/25/2022) for Chronic medical conditions.       Charlott Rakes, MD, FAAFP. [facility] [place], [place]   03/26/2022, 12:49 PM

## 2022-03-27 LAB — CMP14+EGFR
ALT: 12 IU/L (ref 0–32)
AST: 17 IU/L (ref 0–40)
Albumin/Globulin Ratio: 2 (ref 1.2–2.2)
Albumin: 4.7 g/dL (ref 3.8–4.9)
Alkaline Phosphatase: 66 IU/L (ref 44–121)
BUN/Creatinine Ratio: 17 (ref 9–23)
BUN: 9 mg/dL (ref 6–24)
Bilirubin Total: 0.6 mg/dL (ref 0.0–1.2)
CO2: 19 mmol/L — ABNORMAL LOW (ref 20–29)
Calcium: 9.2 mg/dL (ref 8.7–10.2)
Chloride: 104 mmol/L (ref 96–106)
Creatinine, Ser: 0.53 mg/dL — ABNORMAL LOW (ref 0.57–1.00)
Globulin, Total: 2.4 g/dL (ref 1.5–4.5)
Glucose: 126 mg/dL — ABNORMAL HIGH (ref 70–99)
Potassium: 4.4 mmol/L (ref 3.5–5.2)
Sodium: 139 mmol/L (ref 134–144)
Total Protein: 7.1 g/dL (ref 6.0–8.5)
eGFR: 112 mL/min/{1.73_m2} (ref >59)

## 2022-03-27 LAB — LP+NON-HDL CHOLESTEROL
Cholesterol, Total: 114 mg/dL (ref 100–199)
HDL: 44 mg/dL (ref >39)
LDL Chol Calc (NIH): 52 mg/dL (ref 0–99)
Total Non-HDL-Chol (LDL+VLDL): 70 mg/dL (ref 0–129)
Triglycerides: 97 mg/dL (ref 0–149)
VLDL Cholesterol Cal: 18 mg/dL (ref 5–40)

## 2022-03-29 ENCOUNTER — Telehealth: Payer: Self-pay | Admitting: Emergency Medicine

## 2022-03-29 NOTE — Telephone Encounter (Signed)
Routing to PCP for review.

## 2022-03-29 NOTE — Telephone Encounter (Signed)
Copied from [place] [id number]. Topic: General - Other >> Mar 28, 2022  4:02 PM Oley Balm E wrote: Reason for CRM: Pt's daughter called reporting that the patient was unable to be seen for her mammogram because the orders are missing the symptoms/why she needs this. Please advise   Best contact: [phone] [facility]

## 2022-04-15 ENCOUNTER — Ambulatory Visit
Admission: RE | Admit: 2022-04-15 | Discharge: 2022-04-15 | Disposition: A | Discharge status: Home / Self Care | Payer: Medicaid Other | Source: Ambulatory Visit | Attending: Family Medicine | Admitting: Family Medicine

## 2022-04-15 DIAGNOSIS — N6459 Other signs and symptoms in breast: Secondary | ICD-10-CM

## 2022-04-15 DIAGNOSIS — N644 Mastodynia: Secondary | ICD-10-CM | POA: Diagnosis not present

## 2022-04-29 ENCOUNTER — Telehealth: Payer: Self-pay | Admitting: *Deleted

## 2022-04-29 NOTE — Telephone Encounter (Signed)
Message from [facility]-  Dr. Margarita Rana gave her a referral for a thyroid dr.   Pt called because the thyroid dr. Janice Coffin call her. That dr. Does not do thyroid anymore.   Pt. Needs another referral.   Will need Spanish interpreter when you call her back with the information

## 2022-04-29 NOTE — Telephone Encounter (Signed)
-----   Message from Charlott Rakes, MD sent at 04/15/2022  6:16 PM EDT ----- Please inform her that her mammogram is normal

## 2022-04-30 NOTE — Telephone Encounter (Signed)
Message per Maren Reamer with referrals will communicate this to the patient.

## 2022-05-24 ENCOUNTER — Other Ambulatory Visit: Payer: Self-pay | Admitting: Family Medicine

## 2022-05-24 DIAGNOSIS — E119 Type 2 diabetes mellitus without complications: Secondary | ICD-10-CM

## 2022-05-24 DIAGNOSIS — G8929 Other chronic pain: Secondary | ICD-10-CM

## 2022-06-24 DIAGNOSIS — D497 Neoplasm of unspecified behavior of endocrine glands and other parts of nervous system: Secondary | ICD-10-CM | POA: Diagnosis not present

## 2022-06-25 ENCOUNTER — Encounter: Payer: Self-pay | Admitting: Family Medicine

## 2022-06-25 ENCOUNTER — Other Ambulatory Visit: Payer: Self-pay | Admitting: Otolaryngology

## 2022-06-25 ENCOUNTER — Ambulatory Visit: Payer: Medicaid Other | Attending: Family Medicine | Admitting: Family Medicine

## 2022-06-25 VITALS — BP 128/69 | HR 72 | Temp 98.2°F | Ht 61.0 in | Wt 145.0 lb

## 2022-06-25 DIAGNOSIS — E042 Nontoxic multinodular goiter: Secondary | ICD-10-CM

## 2022-06-25 DIAGNOSIS — D497 Neoplasm of unspecified behavior of endocrine glands and other parts of nervous system: Secondary | ICD-10-CM

## 2022-06-25 DIAGNOSIS — Z23 Encounter for immunization: Secondary | ICD-10-CM | POA: Diagnosis not present

## 2022-06-25 DIAGNOSIS — E1169 Type 2 diabetes mellitus with other specified complication: Secondary | ICD-10-CM

## 2022-06-25 DIAGNOSIS — E785 Hyperlipidemia, unspecified: Secondary | ICD-10-CM

## 2022-06-25 DIAGNOSIS — Z1211 Encounter for screening for malignant neoplasm of colon: Secondary | ICD-10-CM

## 2022-06-25 DIAGNOSIS — E1142 Type 2 diabetes mellitus with diabetic polyneuropathy: Secondary | ICD-10-CM

## 2022-06-25 LAB — POCT GLYCOSYLATED HEMOGLOBIN (HGB A1C): HbA1c, POC (controlled diabetic range): 6.5 % (ref 0.0–7.0)

## 2022-06-25 LAB — GLUCOSE, POCT (MANUAL RESULT ENTRY): POC Glucose: 104 mg/dl — AB (ref 70–99)

## 2022-06-25 MED ORDER — ATORVASTATIN CALCIUM 40 MG PO TABS: 40.0000 mg | ORAL_TABLET | Freq: Every day | ORAL | 1 refills | Status: DC

## 2022-06-25 MED ORDER — METFORMIN HCL 500 MG PO TABS: ORAL_TABLET | ORAL | 1 refills | Status: DC

## 2022-06-25 NOTE — Patient Instructions (Signed)
Pruebas de deteccin de cncer colorrectal Colorectal Cancer Screening  Las pruebas de deteccin de cncer colorrectal se usan para confirmar o descartar este tipo de cncer antes de que se desarrollen los sntomas. Colorrectal se refiere al colon y al recto. El colon y el recto se encuentran al final del tubo digestivo y all se eliminan las deposiciones (heces) hacia fuera del cuerpo. Quin debe realizarse la prueba de deteccin? Todos los adultos que tengan entre 92 y 15 aos de edad deben someterse a las pruebas de Programme researcher, broadcasting/film/video. El mdico puede recomendar la realizacin de pruebas de deteccin antes de los 23 aos de [place]. Le realizarn pruebas cada 1 a 10 aos, segn los [place] y el tipo de prueba de Programme researcher, broadcasting/film/video. Las recomendaciones de pruebas de deteccin para los adultos que tienen entre 66 y 75 aos de edad varan segn la salud de Geologist, engineering. Las [employer] de 46 aos ya no deberan someterse a las pruebas de Geographical information systems officer. Usted puede realizarse pruebas de deteccin antes de los 85 aos o con ms frecuencia que otras personas si tiene alguno de estos factores de riesgo: Antecedentes personales o familiares de cncer colorrectal o crecimientos anormales, llamados plipos, en el colon. Una enfermedad inflamatoria del intestino, como colitis ulcerosa o enfermedad de Crohn. Antecedentes de radioterapia en el abdomen o el rea que est [employer] de la cadera (rea plvica) para tratamiento de cncer. Un tipo de sndrome gentico que se transmite de padres a hijos (hereditario), como: Sndrome de Field seismologist. Poliposis adenomatosa familiar. Sndrome de [place]. Sndrome de Peutz-Jeghers. Poliposis asociada a MUTYH (MAP). Antecedentes personales de diabetes. Tipos de pruebas Hay diversos tipos de pruebas de deteccin de cncer colorrectal. Puede tener una o ms de las siguientes cosas: Prueba de sangre oculta en la materia fecal con guayacol. Para realizar esta prueba,  se examina Truddie Coco de heces a fin de Hydrographic surveyor sangre escondida (oculta), lo que podra ser un signo de Surveyor, minerals. Prueba inmunoqumica fecal (PIF). Para realizar esta prueba, se examina Truddie Coco de heces a fin de detectar la presencia de [place], lo que podra ser un signo de Surveyor, minerals. Prueba de cido desoxirribonucleico (ADN) en heces. Para realizar esta prueba, se examina Truddie Coco de heces a fin de Product manager presencia de sangre y cambios en el ADN que podran Cabin crew. Sigmoidoscopa. Durante esta prueba, se utiliza un tubo flexible y delgado con una cmara en el extremo, llamado sigmoidoscopio, para examinar el recto y la parte inferior del colon. Colonoscopa. Durante esta prueba, se utiliza un tubo largo y flexible con una cmara en el extremo, llamado colonoscopio, para examinar todo el colon y el recto. Adems, a veces se toma una muestra de tejido para examinarla con microscopio (biopsia) o se extraen plipos pequeos durante esta prueba. Colonoscopa virtual. En lugar de un colonoscopio, en este tipo de colonoscopa se utiliza una exploracin por tomografa computarizada (TC) para tomar imgenes del colon y el recto. Una TC es un tipo de radiografa que se realiza utilizando computadoras. Cules son los beneficios de las pruebas de deteccin? Las pruebas de deteccin reducen el riesgo de cncer colorrectal y pueden ayudar a identificar el cncer en una etapa temprana, cuando se puede extirpar o tratar con mayor facilidad. Es comn que se formen plipos en el revestimiento del colon, especialmente a medida que envejece. Estos plipos pueden ser cancerosos o volverse cancerosos con el [place]. Las pruebas de deteccin pueden identificar estos plipos. Cules son los  riesgos de la prueba de deteccin? En general, son pruebas seguras. Sin embargo, pueden ocurrir Arboriculturist, por ejemplo: La necesidad de realizarse ms pruebas para [employer] de [place] prueba de [place] de [place]. Las pruebas de muestras de heces tienen menos riesgos que otros tipos de pruebas de Programme researcher, broadcasting/film/video. Estar expuesto a niveles bajos de radiacin, si le hicieron una prueba en la que se utilizan rayos X. Esto puede aumentar ligeramente el riesgo de Animal nutritionist. El beneficio de Futures trader cncer supera el ligero aumento del [place]. Hemorragia, dao en el intestino o infeccin causada por una sigmoidoscopa o colonoscopa. Reaccin a los medicamentos administrados durante una sigmoidoscopa o colonoscopa. Consulte a su mdico para comprender su riesgo de Therapist, music y para elaborar un plan de deteccin que sea adecuado para usted. Preguntas para hacerle al mdico Cundo debo comenzar con las pruebas de deteccin del cncer colorrectal? Cul es mi riesgo de Best boy cncer colorrectal? Con qu frecuencia tengo que realizarme pruebas de deteccin? Qu pruebas de deteccin tengo que realizarme? Cmo obtengo los resultados de la prueba? Convent significan los resultados? Dnde buscar ms informacin Obtenga ms informacin sobre las pruebas de deteccin del Surveyor, minerals en los siguientes sitios: Hydrographic surveyor Society (Asociacin Estadounidense Social worker): cancer.[place] ([place]): cancer.gov Resumen Las pruebas de deteccin de Surveyor, minerals se usan para Firefighter o Teaching laboratory technician tipo de cncer antes de que se desarrollen los sntomas. Todos los adultos que tengan entre 53 y 57 aos de edad deben someterse a las pruebas de Programme researcher, broadcasting/film/video. El mdico puede recomendar la realizacin de pruebas de deteccin antes de los 5 aos de [place]. Usted puede realizarse pruebas de deteccin antes de los 8 aos o con ms frecuencia que otras personas si tiene ciertos factores de [place]. Las pruebas de deteccin reducen el riesgo de cncer colorrectal y pueden ayudar a identificar el cncer en una etapa  temprana, cuando se puede extirpar o tratar con mayor facilidad. Consulte a su mdico para comprender su riesgo de Therapist, music y para elaborar un plan de deteccin que sea adecuado para usted. Esta informacin no tiene Marine scientist el consejo del mdico. Asegrese de hacerle al mdico cualquier pregunta que tenga. Document Revised: 12/29/2019 Document Reviewed: 12/29/2019 Elsevier Patient Education  [place].

## 2022-06-25 NOTE — Progress Notes (Signed)
Subjective:  Patient ID: Debbie Lawson, female    DOB: 01/19/1971  Age: 51 y.o. MRN: [id number]  CC: Diabetes (/)   HPI Debbie Lawson is a 51 y.o. 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 I referred her for a Colonoscopy and notes in chart reveal Debbie Lawson did not wish to schedule.  Debbie Lawson endorses adherence with metformin and is doing well on his statin.  Denies presence of hypoglycemia, visual concerns or neuropathy Debbie Lawson has gabapentin on her med list and is unsure of the indication for gabapentin.  States Debbie Lawson no longer needs it. Past Medical History:  Diagnosis Date   Hyperlipidemia 04/15/2017    Past Surgical History:  Procedure Laterality Date   NO PAST SURGERIES      Family History  Problem Relation Age of Onset   Diabetes Mother    Breast cancer Sister 86    Social History   Socioeconomic History   Marital status: Married    Spouse name: Not on file   Number of children: Not on file   Years of education: Not on file   Highest education level: Not on file  Occupational History   Not on file  Tobacco Use   Smoking status: Never   Smokeless tobacco: Never  Vaping Use   Vaping Use: Never used  Substance and Sexual Activity   Alcohol use: No   Drug use: No   Sexual activity: Yes  Other Topics Concern   Not on file  Social History Narrative   Not on file   Social Determinants of Health   Financial Resource Strain: Not on file  Food Insecurity: Not on file  Transportation Needs: Not on file  Physical Activity: Not on file  Stress: Not on file  Social Connections: Not on file    No Known Allergies  Outpatient Medications Prior to Visit  Medication Sig Dispense Refill   ACCU-CHEK GUIDE test strip USE 1 STRIP ONCE DAILY BEFORE BREAKFAST 100 each  0   Accu-Chek Softclix Lancets lancets USE AS DIRECTED **NEED  OFFICE  REFILLS  FOR  MORE  REFILLS** 100 each 0   Blood Glucose Monitoring Suppl (ACCU-CHEK AVIVA) device Use as instructed daily. 1 each 0   Blood Glucose Monitoring Suppl (ACCU-CHEK GUIDE ME) w/Device KIT Use to check blood sugar one daily. E11.9 1 kit 0   Cholecalciferol (VITAMIN D PO) Take 1 tablet by mouth daily.     clotrimazole (LOTRIMIN) 1 % cream Apply 1 application topically 2 (two) times daily. 45 g 1   diclofenac Sodium (VOLTAREN) 1 % GEL Apply 2 g topically 4 (four) times daily. 100 g 1   ibuprofen (ADVIL) 800 MG tablet TAKE 1 TABLET BY MOUTH THREE TIMES DAILY AS NEEDED FOR PAIN 60 tablet 0   atorvastatin (LIPITOR) 40 MG tablet Take 1 tablet (40 mg total) by mouth daily. 90 tablet 1   gabapentin (NEURONTIN) 300 MG capsule Take 1 capsule (300 mg total) by mouth at bedtime. 30 capsule 3   metFORMIN (GLUCOPHAGE) 500 MG tablet TAKE 2 TABLETs BY MOUTH TWICE DAILY WITH A MEAL 360 tablet 1   No facility-administered medications prior to visit.     ROS Review of Systems  Constitutional:  Negative for activity change and appetite change.  HENT:  Negative for  sinus pressure and sore throat.   Respiratory:  Negative for chest tightness, shortness of breath and wheezing.   Cardiovascular:  Negative for chest pain and palpitations.  Gastrointestinal:  Negative for abdominal distention, abdominal pain and constipation.  Genitourinary: Negative.   Musculoskeletal: Negative.   Psychiatric/Behavioral:  Negative for behavioral problems and dysphoric mood.     Objective:  BP 128/69   Pulse 72   Temp 98.2 F (36.8 C) (Oral)   Ht _0  (1.549 m)   Wt 145 lb (65.8 kg)   SpO2 100%   BMI 27.40 kg/m      06/25/2022   11:33 AM 03/26/2022   11:37 AM 07/04/2021    9:29 AM  BP/Weight  Systolic BP 627 035 009  Diastolic BP 69 76 81  Wt. (Lbs) 145 151.6 149  BMI 27.4 kg/m2 28.64 kg/m2 28.15 kg/m2      Physical  Exam Constitutional:      Appearance: Debbie Lawson is well-developed.  Cardiovascular:     Rate and Rhythm: Normal rate.     Heart sounds: Normal heart sounds. No murmur heard. Pulmonary:     Effort: Pulmonary effort is normal.     Breath sounds: Normal breath sounds. No wheezing or rales.  Chest:     Chest wall: No tenderness.  Abdominal:     General: Bowel sounds are normal. There is no distension.     Palpations: Abdomen is soft. There is no mass.     Tenderness: There is no abdominal tenderness.  Musculoskeletal:        General: Normal range of motion.     Right lower leg: No edema.     Left lower leg: No edema.  Neurological:     Mental Status: Debbie Lawson is alert and oriented to person, place, and time.  Psychiatric:        Mood and Affect: Mood normal.        Latest Ref Rng & Units 03/26/2022   12:30 PM 07/04/2021   10:36 AM 01/19/2021    9:50 AM  CMP  Glucose 70 - 99 mg/dL 126  106  97   BUN 6 - 24 mg/dL _1 Creatinine 0.57 - 1.00 mg/dL 0.53  0.64  0.65   Sodium 134 - 144 mmol/L 139  139  139   Potassium 3.5 - 5.2 mmol/L 4.4  4.3  4.2   Chloride 96 - 106 mmol/L 104  102  102   CO2 20 - 29 mmol/L _2 Calcium 8.7 - 10.2 mg/dL 9.2  9.4  9.4   Total Protein 6.0 - 8.5 g/dL 7.1  7.3  6.9   Total Bilirubin 0.0 - 1.2 mg/dL 0.6  0.6  0.2   Alkaline Phos 44 - 121 IU/L 66  58  68   AST 0 - 40 IU/L _3 ALT 0 - 32 IU/L _4 Lipid Panel     Component Value Date/Time   CHOL 114 03/26/2022 1230   TRIG 97 03/26/2022 1230   HDL 44 03/26/2022 1230   CHOLHDL 3.3 01/19/2021 0950   CHOLHDL 5.4 (H) 08/12/2016 0932   VLDL 27 07/11/2010 2115   LDLCALC 52 03/26/2022 1230    CBC    Component Value Date/Time   WBC 6.2 08/12/2016 0932   RBC 4.87 08/12/2016 0932   HGB 15.1 08/12/2016 0932   HCT 44.6  08/12/2016 0932   PLT 331 08/12/2016 0932   MCV 91.6 08/12/2016 0932   MCH 31.0 08/12/2016 0932   MCHC 33.9 08/12/2016 0932   RDW 12.9 08/12/2016 0932    LYMPHSABS 2,170 08/12/2016 0932   MONOABS 558 08/12/2016 0932   EOSABS 124 08/12/2016 0932   BASOSABS 0 08/12/2016 0932    Lab Results  Component Value Date   HGBA1C 6.5 06/25/2022    Assessment & Plan:  1. Type 2 diabetes mellitus with diabetic polyneuropathy, without long-term current use of insulin (HCC) Controlled with A1c of 6.5 Continue current regimen Counseled on Diabetic diet, my plate method, 282 minutes of moderate intensity exercise/week Blood sugar logs with fasting goals of 80-120 mg/dl, random of less than 180 and in the event of sugars less than 60 mg/dl or greater than 400 mg/dl encouraged to notify the clinic. Advised on the need for annual eye exams, annual foot exams, Pneumonia vaccine. - POCT glucose (manual entry) - POCT glycosylated hemoglobin (Hb A1C) - Microalbumin/Creatinine Ratio, Urine - metFORMIN (GLUCOPHAGE) 500 MG tablet; TAKE 2 TABLETs BY MOUTH TWICE DAILY WITH A MEAL  Dispense: 360 tablet; Refill: 1  2. Screening for colon cancer Declines colonoscopy - Cologuard  3. Hyperlipidemia associated with type 2 diabetes mellitus (HCC) Controlled Low-cholesterol diet - atorvastatin (LIPITOR) 40 MG tablet; Take 1 tablet (40 mg total) by mouth daily.  Dispense: 90 tablet; Refill: 1  4. Multinodular thyroid Previous pathology was suspicious for follicular neoplasm Debbie Lawson will be undergoing another ultrasound and is being followed by ENT   Meds ordered this encounter  Medications   atorvastatin (LIPITOR) 40 MG tablet    Sig: Take 1 tablet (40 mg total) by mouth daily.    Dispense:  90 tablet    Refill:  1   metFORMIN (GLUCOPHAGE) 500 MG tablet    Sig: TAKE 2 TABLETs BY MOUTH TWICE DAILY WITH A MEAL    Dispense:  360 tablet    Refill:  1    Please consider 90 day supplies to promote better adherence    Follow-up: Return in about 6 months (around 12/25/2022) for Chronic medical conditions.       Charlott Rakes, MD, FAAFP. [facility] and [place], [place]   06/25/2022, 12:21 PM

## 2022-06-27 LAB — MICROALBUMIN / CREATININE URINE RATIO
Creatinine, Urine: 107.6 mg/dL
Microalb/Creat Ratio: 7 mg/g creat (ref 0–29)
Microalbumin, Urine: 7.2 ug/mL

## 2022-07-03 ENCOUNTER — Ambulatory Visit
Admission: RE | Admit: 2022-07-03 | Discharge: 2022-07-03 | Disposition: A | Discharge status: Home / Self Care | Payer: Medicaid Other | Source: Ambulatory Visit | Attending: Otolaryngology | Admitting: Otolaryngology

## 2022-07-03 DIAGNOSIS — D497 Neoplasm of unspecified behavior of endocrine glands and other parts of nervous system: Secondary | ICD-10-CM

## 2022-07-03 DIAGNOSIS — E041 Nontoxic single thyroid nodule: Secondary | ICD-10-CM | POA: Diagnosis not present

## 2022-07-26 DIAGNOSIS — E042 Nontoxic multinodular goiter: Secondary | ICD-10-CM | POA: Diagnosis not present

## 2022-08-18 ENCOUNTER — Other Ambulatory Visit: Payer: Self-pay | Admitting: Family Medicine

## 2022-08-18 DIAGNOSIS — E119 Type 2 diabetes mellitus without complications: Secondary | ICD-10-CM

## 2022-08-19 NOTE — Telephone Encounter (Signed)
Requested Prescriptions  Pending Prescriptions Disp Refills   ACCU-CHEK GUIDE test strip [Pharmacy Med Name: Accu-Chek Guide In Vitro Strip] 100 each 0    Sig: USE 1 STRIP ONCE DAILY BEFORE BREAKFAST **NEED  APPT  FOR  MORE  REFILLS**     Endocrinology: Diabetes - Testing Supplies Passed - 08/18/2022 11:47 AM      Passed - Valid encounter within last 12 months    Recent Outpatient Visits           1 month ago Type 2 diabetes mellitus with diabetic polyneuropathy, without long-term current use of insulin ([place])   [place], [place], MD   4 months ago Type 2 diabetes mellitus with diabetic polyneuropathy, without long-term current use of insulin ([place])   [place], [place], MD   1 year ago Type 2 diabetes mellitus with diabetic polyneuropathy, without long-term current use of insulin ([place])   [place] Holliday, Charlane Ferretti, MD   1 year ago [place] Charlott Rakes, MD   2 years ago Screening for cervical cancer   [place], [place], MD       Future Appointments             In 4 months Charlott Rakes, MD [place]

## 2022-12-25 ENCOUNTER — Telehealth: Payer: Self-pay

## 2022-12-25 ENCOUNTER — Encounter: Payer: Self-pay | Admitting: Family Medicine

## 2022-12-25 ENCOUNTER — Ambulatory Visit: Payer: Medicaid Other | Attending: Family Medicine | Admitting: Family Medicine

## 2022-12-25 VITALS — BP 131/75 | HR 73 | Ht 61.0 in | Wt 147.8 lb

## 2022-12-25 DIAGNOSIS — Z7984 Long term (current) use of oral hypoglycemic drugs: Secondary | ICD-10-CM | POA: Diagnosis not present

## 2022-12-25 DIAGNOSIS — E1169 Type 2 diabetes mellitus with other specified complication: Secondary | ICD-10-CM | POA: Diagnosis not present

## 2022-12-25 DIAGNOSIS — M19042 Primary osteoarthritis, left hand: Secondary | ICD-10-CM | POA: Diagnosis not present

## 2022-12-25 DIAGNOSIS — M19041 Primary osteoarthritis, right hand: Secondary | ICD-10-CM | POA: Diagnosis not present

## 2022-12-25 DIAGNOSIS — E1142 Type 2 diabetes mellitus with diabetic polyneuropathy: Secondary | ICD-10-CM | POA: Diagnosis not present

## 2022-12-25 DIAGNOSIS — E785 Hyperlipidemia, unspecified: Secondary | ICD-10-CM

## 2022-12-25 LAB — POCT GLYCOSYLATED HEMOGLOBIN (HGB A1C): HbA1c, POC (controlled diabetic range): 5.8 % (ref 0.0–7.0)

## 2022-12-25 MED ORDER — ATORVASTATIN CALCIUM 40 MG PO TABS: 40.0000 mg | ORAL_TABLET | Freq: Every day | ORAL | 1 refills | Status: DC

## 2022-12-25 MED ORDER — METFORMIN HCL 500 MG PO TABS: 500.0000 mg | ORAL_TABLET | Freq: Every day | ORAL | 1 refills | Status: DC

## 2022-12-25 NOTE — Patient Instructions (Signed)
Artritis Arthritis La palabra "artritis" significa tener dolor en las articulaciones. Tambin puede referirse a una enfermedad de las articulaciones. Una articulacin es Immunologist en el que se juntan los [place]. Hay ms de 100 tipos de artritis. Cules son las causas? Desgaste de Risk analyst. Esta es la causa ms frecuente. Cantidad elevada de una sustancia bioqumica llamada "cido rico" en la sangre, que provoca dolor en la articulacin (gota). Dolor e hinchazn (inflamacin) en la articulacin. Infeccin en una articulacin. Lesiones en una articulacin. Neomia Dear reaccin a medicamentos (alergia). En algunos casos, es posible que la causa se desconozca. Cules son los signos o los sntomas? Dolor en una articulacin al [employer]. Enrojecimiento de Risk analyst. Hinchazn en una articulacin. Rigidez en una articulacin. Calor que proviene de Nurse, learning disability. Grant Ruts. Sensacin de estar enfermo. Cmo se trata? El tratamiento de esta afeccin puede incluir: Dar tratamiento a la causa si se la conoce. Reposo. Levantamiento (elevacin) de la articulacin. Aplicar compresas fras o calientes sobre Nurse, learning disability. Medicamentos para tratar los sntomas y reducir Chief Technology Officer y la hinchazn. Inyecciones de medicamentos, como cortisona, Nurse, learning disability. Tambin pueden indicarle que haga cambios en su vida, como hacer ejercicio y Publishing copy de [place]. Siga estas instrucciones en su casa: Medicamentos Use los medicamentos de venta libre y los recetados solamente como se lo haya indicado el mdico. No tome aspirina para el dolor si el mdico le dice que puede tener gota. Actividad Ponga la articulacin en reposo si el mdico se lo indica. Evite las actividades que [employer]. Haga ejercicios para la articulacin con regularidad tal como se lo indic el mdico. Pruebe hacer ejercicios como: Natacin. Gimnasia acutica. Andar en bicicleta. Caminar. Control del dolor, la rigidez y la  hinchazn     Si se lo indican, aplique hielo en la zona afectada. Para hacer esto: Ponga el hielo en una bolsa plstica. Coloque una toalla entre la piel y Copy. Aplique el hielo durante 20 minutos, 2 a 3 veces por da. Retire el hielo si la piel se le pone de color rojo brillante. Esto es [employer]. Si no puede sentir dolor, calor o fro, tiene un mayor riesgo de que se dae la zona. Si la articulacin est hinchada, levntela (elvela) por encima del nivel del corazn como se lo haya indicado el mdico. Si tiene la articulacin rgida por la maana, pruebe tomar una ducha tibia. Si se lo indican, aplique calor en la zona afectada. Hgalo con la frecuencia que le haya indicado el mdico. Use la fuente de calor que el mdico le recomiende, como una compresa de calor hmedo o una almohadilla trmica. Si tiene diabetes, no aplique calor sin consultar a su mdico. Para aplicar calor: Coloque una toalla entre la piel y la fuente de Airline pilot. Aplique calor durante 20 a 30 minutos. Retire la fuente de calor si la piel se le pone de color rojo brillante. Esto es [employer]. Si no puede sentir dolor, calor o fro, tiene un mayor riesgo de [place]. Instrucciones generales Mantenga un peso saludable. Siga las instrucciones de su mdico con respecto al control de su peso. No fume ni consuma ningn producto que contenga nicotina o tabaco. Si necesita ayuda para dejar de fumar, consulte al mdico. Concurra a todas las visitas de seguimiento. Dnde buscar ms informacin [employer] of Health (Institutos Nacionales de la Salud): www.niams.http://www.myers.net/ Comunquese con un mdico si: El Product/process development scientist. Tiene fiebre. Solicite ayuda de inmediato si: Tiene un dolor muy intenso en la  articulacin. Tiene hinchazn en la articulacin. La articulacin est roja. Comienza a tener hinchazn y Tax adviser. Tiene un dolor muy intenso en la espalda. Tiene la pierna muy  dbil. Resumen La palabra "artritis" significa tener dolor en las articulaciones. Tambin puede referirse a una enfermedad de las articulaciones. Una articulacin es Immunologist en el que se juntan los [place]. La causa ms frecuente de esta afeccin es el desgaste de una articulacin. Los principales sntomas de esta afeccin son enrojecimiento, hinchazn o Geophysical data processor. Esta afeccin se trata con reposo, elevacin de la articulacin, medicamentos y colocando compresas fras o calientes sobre la articulacin. Siga las instrucciones del mdico acerca de los medicamentos, la [place], los ejercicios y otros tratamientos para el cuidado Facilities manager. Esta informacin no tiene Theme park manager el consejo del mdico. Asegrese de hacerle al mdico cualquier pregunta que tenga. Document Revised: 05/17/2021 Document Reviewed: 05/17/2021 Elsevier Patient Education  2024 [employer].

## 2022-12-25 NOTE — Telephone Encounter (Signed)
Pharmacy has been called and informed on what medication to dispense.

## 2022-12-25 NOTE — Progress Notes (Signed)
Subjective:  Patient ID: Debbie Lawson, female    DOB: [birth year]  Age: 52 y.o. MRN: [id number]  CC: Diabetes   HPI Jyah Bonnet is a 52 y.o. 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 A1c is 5.8 down from 6.5. She has no neuropathy or visual concerns. She has been without her Statin x 2 months as she ran out of refills.  She has intermittent pain and stiffness in the joints of her hands for which she uses Ibuprofen with some relief.  She saw ENT with [facility] for follow up of her thyroid nodules and per notes there is regression of prior follicular nodule and plan is to follow up with repeat Ultrasound of the thyroid in one year. Past Medical History:  Diagnosis Date   Hyperlipidemia 04/15/2017    Past Surgical History:  Procedure Laterality Date   NO PAST SURGERIES      Family History  Problem Relation Age of Onset   Diabetes Mother    Breast cancer Sister 47    Social History   Socioeconomic History   Marital status: Married    Spouse name: Not on file   Number of children: Not on file   Years of education: Not on file   Highest education level: Not on file  Occupational History   Not on file  Tobacco Use   Smoking status: Never   Smokeless tobacco: Never  Vaping Use   Vaping Use: Never used  Substance and Sexual Activity   Alcohol use: No   Drug use: No   Sexual activity: Yes  Other Topics Concern   Not on file  Social History Narrative   Not on file   Social Determinants of Health   Financial Resource Strain: Not on file  Food Insecurity: Not on file  Transportation Needs: Not on file  Physical Activity: Not on file  Stress: Not on file  Social Connections: Not on file    No Known Allergies  Outpatient Medications Prior to Visit   Medication Sig Dispense Refill   Accu-Chek Softclix Lancets lancets USE AS DIRECTED **NEED  OFFICE  REFILLS  FOR  MORE  REFILLS** 100 each 0   Blood Glucose Monitoring Suppl (ACCU-CHEK AVIVA) device Use as instructed daily. 1 each 0   Blood Glucose Monitoring Suppl (ACCU-CHEK GUIDE ME) w/Device KIT Use to check blood sugar one daily. E11.9 1 kit 0   Cholecalciferol (VITAMIN D PO) Take 1 tablet by mouth daily.     glucose blood (ACCU-CHEK GUIDE) test strip 1 each by Other route daily before breakfast. 100 each 0   ibuprofen (ADVIL) 800 MG tablet TAKE 1 TABLET BY MOUTH THREE TIMES DAILY AS NEEDED FOR PAIN 60 tablet 0   atorvastatin (LIPITOR) 40 MG tablet Take 1 tablet (40 mg total) by mouth daily. 90 tablet 1   metFORMIN (GLUCOPHAGE) 500 MG tablet TAKE 2 TABLETs BY MOUTH TWICE DAILY WITH A MEAL 360 tablet 1   clotrimazole (LOTRIMIN) 1 % cream Apply 1 application topically 2 (two) times daily. (Patient not taking: Reported on 12/25/2022) 45 g 1   diclofenac Sodium (VOLTAREN) 1 % GEL Apply 2 g topically 4 (four) times daily. (Patient not taking: Reported on 12/25/2022) 100 g 1   No facility-administered medications prior to visit.  ROS Review of Systems  Constitutional:  Negative for activity change and appetite change.  HENT:  Negative for sinus pressure and sore throat.   Respiratory:  Negative for chest tightness, shortness of breath and wheezing.   Cardiovascular:  Negative for chest pain and palpitations.  Gastrointestinal:  Negative for abdominal distention, abdominal pain and constipation.  Genitourinary: Negative.   Musculoskeletal: Negative.   Psychiatric/Behavioral:  Negative for behavioral problems and dysphoric mood.     Objective:  BP 131/75   Pulse 73   Ht 5\' 1"  (1.549 m)   Wt 147 lb 12.8 oz (67 kg)   SpO2 100%   BMI 27.93 kg/m      12/25/2022    9:21 AM 06/25/2022   11:33 AM 03/26/2022   11:37 AM  BP/Weight  Systolic BP 131 128 125  Diastolic BP 75 69 76  Wt. (Lbs)  147.8 145 151.6  BMI 27.93 kg/m2 27.4 kg/m2 28.64 kg/m2      Physical Exam Constitutional:      Appearance: She is well-developed.  Cardiovascular:     Rate and Rhythm: Normal rate.     Heart sounds: Normal heart sounds. No murmur heard. Pulmonary:     Effort: Pulmonary effort is normal.     Breath sounds: Normal breath sounds. No wheezing or rales.  Chest:     Chest wall: No tenderness.  Abdominal:     General: Bowel sounds are normal. There is no distension.     Palpations: Abdomen is soft. There is no mass.     Tenderness: There is no abdominal tenderness.  Musculoskeletal:        General: Normal range of motion.     Right lower leg: No edema.     Left lower leg: No edema.  Neurological:     Mental Status: She is alert and oriented to person, place, and time.  Psychiatric:        Mood and Affect: Mood normal.    Diabetic Foot Exam - Simple   Simple Foot Form Diabetic Foot exam was performed with the following findings: Yes 12/25/2022  9:35 AM  Visual Inspection No deformities, no ulcerations, no other skin breakdown bilaterally: Yes Sensation Testing Intact to touch and monofilament testing bilaterally: Yes Pulse Check Posterior Tibialis and Dorsalis pulse intact bilaterally: Yes Comments        Latest Ref Rng & Units 03/26/2022   12:30 PM 07/04/2021   10:36 AM 01/19/2021    9:50 AM  CMP  Glucose 70 - 99 mg/dL 161  096  97   BUN 6 - 24 mg/dL 9  9  9    Creatinine 0.57 - 1.00 mg/dL 0.45  4.09  8.11   Sodium 134 - 144 mmol/L 139  139  139   Potassium 3.5 - 5.2 mmol/L 4.4  4.3  4.2   Chloride 96 - 106 mmol/L 104  102  102   CO2 20 - 29 mmol/L 19  22  22    Calcium 8.7 - 10.2 mg/dL 9.2  9.4  9.4   Total Protein 6.0 - 8.5 g/dL 7.1  7.3  6.9   Total Bilirubin 0.0 - 1.2 mg/dL 0.6  0.6  0.2   Alkaline Phos 44 - 121 IU/L 66  58  68   AST 0 - 40 IU/L 17  14  19    ALT 0 - 32 IU/L 12  11  19      Lipid Panel     Component Value Date/Time  CHOL 114 03/26/2022 1230    TRIG 97 03/26/2022 1230   HDL 44 03/26/2022 1230   CHOLHDL 3.3 01/19/2021 0950   CHOLHDL 5.4 (H) 08/12/2016 0932   VLDL 27 07/11/2010 2115   LDLCALC 52 03/26/2022 1230    CBC    Component Value Date/Time   WBC 6.2 08/12/2016 0932   RBC 4.87 08/12/2016 0932   HGB 15.1 08/12/2016 0932   HCT 44.6 08/12/2016 0932   PLT 331 08/12/2016 0932   MCV 91.6 08/12/2016 0932   MCH 31.0 08/12/2016 0932   MCHC 33.9 08/12/2016 0932   RDW 12.9 08/12/2016 0932   LYMPHSABS 2,170 08/12/2016 0932   MONOABS 558 08/12/2016 0932   EOSABS 124 08/12/2016 0932   BASOSABS 0 08/12/2016 0932    Lab Results  Component Value Date   HGBA1C 5.8 12/25/2022    Assessment & Plan:  1. Type 2 diabetes mellitus with diabetic polyneuropathy, without long-term current use of insulin (HCC) Controlled with A1c of 5.8 down from 6.5 Decrease metformin dose from 1000 mg daily to 500 mg daily Counseled on Diabetic diet, my plate method, 962 minutes of moderate intensity exercise/week Blood sugar logs with fasting goals of 80-120 mg/dl, random of less than 952 and in the event of sugars less than 60 mg/dl or greater than 841 mg/dl encouraged to notify the clinic. Advised on the need for annual eye exams, annual foot exams, Pneumonia vaccine. - POCT glycosylated hemoglobin (Hb A1C) - Microalbumin / creatinine urine ratio - CMP14+EGFR - metFORMIN (GLUCOPHAGE) 500 MG tablet; Take 1 tablet (500 mg total) by mouth daily with breakfast.  Dispense: 90 tablet; Refill: 1  2. Osteoarthritis of both hands, unspecified osteoarthritis type Stable Controlled on ibuprofen  3. Hyperlipidemia associated with type 2 diabetes mellitus (HCC) Controlled Will send off lipid panel If cholesterol is elevated I will make no regimen changes especially since she has been out of her statin x 2 months. - atorvastatin (LIPITOR) 40 MG tablet; Take 1 tablet (40 mg total) by mouth daily.  Dispense: 90 tablet; Refill: 1 - LP+Non-HDL  Cholesterol  Meds ordered this encounter  Medications   DISCONTD: metFORMIN (GLUCOPHAGE) 500 MG tablet    Sig: Take 1 tablet (500 mg total) by mouth daily with breakfast. TAKE 2 TABLETs BY MOUTH TWICE DAILY WITH A MEAL    Dispense:  90 tablet    Refill:  1    Please consider 90 day supplies to promote better adherence   atorvastatin (LIPITOR) 40 MG tablet    Sig: Take 1 tablet (40 mg total) by mouth daily.    Dispense:  90 tablet    Refill:  1   metFORMIN (GLUCOPHAGE) 500 MG tablet    Sig: Take 1 tablet (500 mg total) by mouth daily with breakfast.    Dispense:  90 tablet    Refill:  1    Discontinue 1000 mg    Follow-up: Return in about 6 months (around 06/26/2023) for Chronic medical conditions.       Hoy Register, MD, FAAFP. [facility] [place], [place] [phone]   12/25/2022, 9:48 AM

## 2022-12-25 NOTE — Telephone Encounter (Signed)
Copied from CRM [id number]. Topic: General - Other >> Dec 25, 2022 10:07 AM Debbie Lawson wrote: Reason for CRM: [place] [facility] has called for additional clarity regarding the prescription and directions for the patient's recent prescription of metFORMIN (GLUCOPHAGE) 500 MG tablet [[id number]]  Please contact the Pharmacy further when available

## 2022-12-26 LAB — CMP14+EGFR
ALT: 9 IU/L (ref 0–32)
AST: 15 IU/L (ref 0–40)
Albumin/Globulin Ratio: 1.6 (ref 1.2–2.2)
Albumin: 4.2 g/dL (ref 3.8–4.9)
Alkaline Phosphatase: 68 IU/L (ref 44–121)
BUN/Creatinine Ratio: 18 (ref 9–23)
BUN: 9 mg/dL (ref 6–24)
Bilirubin Total: <0.2 mg/dL (ref 0.0–1.2)
CO2: 20 mmol/L (ref 20–29)
Calcium: 9.4 mg/dL (ref 8.7–10.2)
Chloride: 103 mmol/L (ref 96–106)
Creatinine, Ser: 0.51 mg/dL — ABNORMAL LOW (ref 0.57–1.00)
Globulin, Total: 2.6 g/dL (ref 1.5–4.5)
Glucose: 108 mg/dL — ABNORMAL HIGH (ref 70–99)
Potassium: 4.8 mmol/L (ref 3.5–5.2)
Sodium: 141 mmol/L (ref 134–144)
Total Protein: 6.8 g/dL (ref 6.0–8.5)
eGFR: 112 mL/min/{1.73_m2} (ref >59)

## 2022-12-26 LAB — LP+NON-HDL CHOLESTEROL
Cholesterol, Total: 180 mg/dL (ref 100–199)
HDL: 40 mg/dL (ref >39)
LDL Chol Calc (NIH): 103 mg/dL — ABNORMAL HIGH (ref 0–99)
Total Non-HDL-Chol (LDL+VLDL): 140 mg/dL — ABNORMAL HIGH (ref 0–129)
Triglycerides: 216 mg/dL — ABNORMAL HIGH (ref 0–149)
VLDL Cholesterol Cal: 37 mg/dL (ref 5–40)

## 2023-06-02 ENCOUNTER — Other Ambulatory Visit: Payer: Self-pay | Admitting: Family Medicine

## 2023-06-02 DIAGNOSIS — E119 Type 2 diabetes mellitus without complications: Secondary | ICD-10-CM

## 2023-06-02 DIAGNOSIS — E1142 Type 2 diabetes mellitus with diabetic polyneuropathy: Secondary | ICD-10-CM

## 2023-06-03 NOTE — Telephone Encounter (Signed)
Requested Prescriptions  Pending Prescriptions Disp Refills   metFORMIN (GLUCOPHAGE) 500 MG tablet [Pharmacy Med Name: metFORMIN HCl 500 MG Oral Tablet] 90 tablet 0    Sig: Take 1 tablet by mouth once daily with breakfast     Endocrinology:  Diabetes - Biguanides Failed - 06/02/2023  2:13 PM      Failed - Cr in normal range and within 360 days    Creat  Date Value Ref Range Status  08/12/2016 0.49 (L) 0.50 - 1.10 mg/dL Final   Creatinine, Ser  Date Value Ref Range Status  12/25/2022 0.51 (L) 0.57 - 1.00 mg/dL Final         Failed - B12 Level in normal range and within 720 days    No results found for: "VITAMINB12"       Failed - CBC within normal limits and completed in the last 12 months    WBC  Date Value Ref Range Status  08/12/2016 6.2 3.8 - 10.8 K/uL Final   RBC  Date Value Ref Range Status  08/12/2016 4.87 3.80 - 5.10 MIL/uL Final   Hemoglobin  Date Value Ref Range Status  08/12/2016 15.1 11.7 - 15.5 g/dL Final   HCT  Date Value Ref Range Status  08/12/2016 44.6 35.0 - 45.0 % Final   MCHC  Date Value Ref Range Status  08/12/2016 33.9 32.0 - 36.0 g/dL Final   [facility]  Date Value Ref Range Status  08/12/2016 31.0 27.0 - 33.0 pg Final   MCV  Date Value Ref Range Status  08/12/2016 91.6 80.0 - 100.0 fL Final   No results found for: "PLTCOUNTKUC", "LABPLAT", "POCPLA" RDW  Date Value Ref Range Status  08/12/2016 12.9 11.0 - 15.0 % Final         Passed - HBA1C is between 0 and 7.9 and within 180 days    HbA1c, POC (controlled diabetic range)  Date Value Ref Range Status  12/25/2022 5.8 0.0 - 7.0 % Final         Passed - eGFR in normal range and within 360 days    GFR, Est African American  Date Value Ref Range Status  08/12/2016 >89 >=60 mL/min Final   GFR calc Af Amer  Date Value Ref Range Status  05/09/2020 126 >59 mL/min/1.73 Final    Comment:    **In accordance with recommendations from the NKF-ASN Task force,**   [facility] is in the process of  updating its eGFR calculation to the   2021 CKD-EPI creatinine equation that estimates kidney function   without a race variable.    GFR, Est Non African American  Date Value Ref Range Status  08/12/2016 >89 >=60 mL/min Final   GFR calc non Af Amer  Date Value Ref Range Status  05/09/2020 109 >59 mL/min/1.73 Final   eGFR  Date Value Ref Range Status  12/25/2022 112 >59 mL/min/1.73 Final         Passed - Valid encounter within last 6 months    Recent Outpatient Visits           5 months ago Type 2 diabetes mellitus with diabetic polyneuropathy, without long-term current use of insulin (HCC)   [facility] Dept Of [place]. [facility] Hoy Register, MD   11 months ago Type 2 diabetes mellitus with diabetic polyneuropathy, without long-term current use of insulin (HCC)   [place] [facility] [place] - A Dept Of . [facility], [place],  MD   1 year ago Type 2 diabetes mellitus with diabetic polyneuropathy, without long-term current use of insulin (HCC)   [place] [facility] - A Dept Of [place]. [facility] Hoy Register, MD   1 year ago Type 2 diabetes mellitus with diabetic polyneuropathy, without long-term current use of insulin (HCC)   [place] [facility] [place] - A Dept Of [place]. [facility] Hoy Register, MD   2 years ago Costochondritis   [place] [facility] [place] - A Dept Of [place]. [facility] Hoy Register, MD       Future Appointments             In 4 weeks Hoy Register, MD [facility] - A Dept Of Eligha Bridegroom. [facility]             ACCU-CHEK GUIDE test strip [place] Med Name: Accu-Chek Guide In Vitro Strip] 100 each 0    Sig: USE 1 STRIP TO CHECK GLUCOSE ONCE DAILY BEFORE BREAKFAST     Endocrinology: Diabetes - Testing Supplies Passed - 06/02/2023  2:13 PM      Passed - Valid encounter within last 12  months    Recent Outpatient Visits           5 months ago Type 2 diabetes mellitus with diabetic polyneuropathy, without long-term current use of insulin (HCC)   [place] [facility] [place] - A Dept Of [place]. [facility] Hoy Register, MD   11 months ago Type 2 diabetes mellitus with diabetic polyneuropathy, without long-term current use of insulin (HCC)   [place] [facility] [place] - A Dept Of [place]. [facility] Hoy Register, MD   1 year ago Type 2 diabetes mellitus with diabetic polyneuropathy, without long-term current use of insulin (HCC)   [place] [facility] [place] - A Dept Of [place]. [facility] Hoy Register, MD   1 year ago Type 2 diabetes mellitus with diabetic polyneuropathy, without long-term current use of insulin (HCC)   [place] [facility] [place] - A Dept Of [place]. [facility] Hoy Register, MD   2 years ago Costochondritis   [place] [facility] [place] - A Dept Of [place]. [facility] Hoy Register, MD       Future Appointments             In 4 weeks Hoy Register, MD [facility] [place] - A Dept Of [place]a Bridegroom. [facility]             Accu-Chek Softclix Lancets lancets [Pharmacy Med Name: SOFTCLIX LANCETS    MIS] 100 each 0    Sig: USE AS DIRECTED **  NEED  OFFICE  REFILLS  FOR  MORE  REFILLS**     Endocrinology: Diabetes - Testing Supplies Passed - 06/02/2023  2:13 PM      Passed - Valid encounter within last 12 months    Recent Outpatient Visits           5 months ago Type 2 diabetes mellitus with diabetic polyneuropathy, without long-term current use of insulin (HCC)    [facility] - [facility] Dept Of [place]. [facility] Hoy Register, MD   11 months ago Type 2 diabetes mellitus with diabetic polyneuropathy, without long-term current use of insulin (HCC)    [facility] - [facility] Dept  Of [place]. [facility] Hoy Register, MD   1 year ago Type 2 diabetes mellitus with diabetic polyneuropathy, without long-term current use of insulin (HCC)   [place] [facility] [place] - A Dept Of [place]. [facility] Hoy Register, MD   1 year ago Type 2 diabetes mellitus with diabetic polyneuropathy, without long-term current use of insulin (HCC)   [place] [facility] [place] - A Dept Of [place]. [facility] Hoy Register, MD   2 years ago Costochondritis   [place] [facility] [place] - A Dept Of [place]. [facility] Hoy Register, MD       Future Appointments             In 4 weeks Hoy Register, MD [facility] - A Dept Of [place] Bridegroom. [facility]

## 2023-06-03 NOTE — Telephone Encounter (Signed)
Requested medications are due for refill today.  yes  Requested medications are on the active medications list.  yes  Last refill. 12/25/2022 #90 1 rf  Future visit scheduled.   yes  Notes to clinic.  Expired CBC.    Requested Prescriptions  Pending Prescriptions Disp Refills   metFORMIN (GLUCOPHAGE) 500 MG tablet [Pharmacy Med Name: metFORMIN HCl 500 MG Oral Tablet] 90 tablet 0    Sig: Take 1 tablet by mouth once daily with breakfast     Endocrinology:  Diabetes - Biguanides Failed - 06/02/2023  2:13 PM      Failed - Cr in normal range and within 360 days    Creat  Date Value Ref Range Status  08/12/2016 0.49 (L) 0.50 - 1.10 mg/dL Final   Creatinine, Ser  Date Value Ref Range Status  12/25/2022 0.51 (L) 0.57 - 1.00 mg/dL Final         Failed - B12 Level in normal range and within 720 days    No results found for: "VITAMINB12"       Failed - CBC within normal limits and completed in the last 12 months    WBC  Date Value Ref Range Status  08/12/2016 6.2 3.8 - 10.8 K/uL Final   RBC  Date Value Ref Range Status  08/12/2016 4.87 3.80 - 5.10 MIL/uL Final   Hemoglobin  Date Value Ref Range Status  08/12/2016 15.1 11.7 - 15.5 g/dL Final   HCT  Date Value Ref Range Status  08/12/2016 44.6 35.0 - 45.0 % Final   MCHC  Date Value Ref Range Status  08/12/2016 33.9 32.0 - 36.0 g/dL Final   [facility]  Date Value Ref Range Status  08/12/2016 31.0 27.0 - 33.0 pg Final   MCV  Date Value Ref Range Status  08/12/2016 91.6 80.0 - 100.0 fL Final   No results found for: "PLTCOUNTKUC", "LABPLAT", "POCPLA" RDW  Date Value Ref Range Status  08/12/2016 12.9 11.0 - 15.0 % Final         Passed - HBA1C is between 0 and 7.9 and within 180 days    HbA1c, POC (controlled diabetic range)  Date Value Ref Range Status  12/25/2022 5.8 0.0 - 7.0 % Final         Passed - eGFR in normal range and within 360 days    GFR, Est African American  Date Value Ref Range Status  08/12/2016 >89  >=60 mL/min Final   GFR calc Af Amer  Date Value Ref Range Status  05/09/2020 126 >59 mL/min/1.73 Final    Comment:    **In accordance with recommendations from the NKF-ASN Task force,**   [facility] is in the process of updating its eGFR calculation to the   2021 CKD-EPI creatinine equation that estimates kidney function   without a race variable.    GFR, Est Non African American  Date Value Ref Range Status  08/12/2016 >89 >=60 mL/min Final   GFR calc non Af Amer  Date Value Ref Range Status  05/09/2020 109 >59 mL/min/1.73 Final   eGFR  Date Value Ref Range Status  12/25/2022 112 >59 mL/min/1.73 Final         Passed - Valid encounter within last 6 months    Recent Outpatient Visits           5 months ago Type 2 diabetes mellitus with diabetic polyneuropathy, without long-term current use of insulin (HCC)   [facility] [place] [facility] Dept Of [place]  [facility], MD   11 months ago Type 2 diabetes mellitus with diabetic polyneuropathy, without long-term current use of insulin (HCC)   [facility] [facility] [place] - A Dept Of [place]. [facility] Hoy Register, MD   1 year ago Type 2 diabetes mellitus with diabetic polyneuropathy, without long-term current use of insulin (HCC)   [place] [facility] [place] - A Dept Of [place]. [facility] Hoy Register, MD   1 year ago Type 2 diabetes mellitus with diabetic polyneuropathy, without long-term current use of insulin (HCC)   [facility] [facility] [place] - A Dept Of [place]. [facility] Hoy Register, MD   2 years ago Costochondritis   [facility] [facility] [place] - A Dept Of [place]. [facility] Hoy Register, MD       Future Appointments             In 4 weeks Hoy Register, MD [facility] - A Dept Of [place]igh[place] Bridegroom. [facility]            Signed Prescriptions Disp Refills   ACCU-CHEK GUIDE test  strip 100 each 0    Sig: USE 1 STRIP TO CHECK GLUCOSE ONCE DAILY BEFORE BREAKFAST     Endocrinology: Diabetes - Testing Supplies Passed - 06/02/2023  2:13 PM      Passed - Valid encounter within last 12 months    Recent Outpatient Visits           5 months ago Type 2 diabetes mellitus with diabetic polyneuropathy, without long-term current use of insulin (HCC)   [place] [facility] [place] - A Dept Of [place]. [facility] Hoy Register, MD   11 months ago Type 2 diabetes mellitus with diabetic polyneuropathy, without long-term current use of insulin (HCC)   [place] [facility] [place] - A Dept Of [place]. [facility] Hoy Register, MD   1 year ago Type 2 diabetes mellitus with diabetic polyneuropathy, without long-term current use of insulin (HCC)   [place] [facility] [place] - A Dept Of [place]. [facility] Hoy Register, MD   1 year ago Type 2 diabetes mellitus with diabetic polyneuropathy, without long-term current use of insulin (HCC)   [facility] - A Dept Of [place]. [facility] Hoy Register, MD   2 years ago Costochondritis    [facility] [place] - A Dept Of [place]. [facility] Hoy Register, MD       Future Appointments             In 4 weeks Hoy Register, MD [facility] [place] - A Dept Of [place] Brideg[place]. [facility]             Accu-Chek Softclix Lancets lancets 100 each 0    Sig: USE AS DIRECTED **  NEED  OFFICE  REFILLS  FOR  MORE  REFILLS**     Endocrinology: Diabetes - Testing Supplies Passed - 06/02/2023  2:13 PM      Passed - Valid encounter within last 12 months    Recent Outpatient Visits           5 months ago Type 2 diabetes mellitus with diabetic polyneuropathy, without long-term current use of insulin (HCC)    [facility] [place] - A Dept Of [place]. [facility]  Hoy Register, MD   11 months ago Type  2 diabetes mellitus with diabetic polyneuropathy, without long-term current use of insulin (HCC)   [place] [facility] - A Dept Of [place]. [facility] Hoy Register, MD   1 year ago Type 2 diabetes mellitus with diabetic polyneuropathy, without long-term current use of insulin (HCC)   [place] [facility] [place] - A Dept Of [place]. [facility] Hoy Register, MD   1 year ago Type 2 diabetes mellitus with diabetic polyneuropathy, without long-term current use of insulin (HCC)   [place] [facility] [place] - A Dept Of [place]. [facility] Hoy Register, MD   2 years ago Costochondritis   [place] [facility] [place] - A Dept Of [place]. [facility] Hoy Register, MD       Future Appointments             In 4 weeks Hoy Register, MD [facility] [place] - A Dept Of Eligha Bridegroom. [facility]

## 2023-06-04 ENCOUNTER — Other Ambulatory Visit: Payer: Self-pay | Admitting: Family Medicine

## 2023-06-04 DIAGNOSIS — E1142 Type 2 diabetes mellitus with diabetic polyneuropathy: Secondary | ICD-10-CM

## 2023-06-04 DIAGNOSIS — E1169 Type 2 diabetes mellitus with other specified complication: Secondary | ICD-10-CM

## 2023-06-04 DIAGNOSIS — M545 Low back pain, unspecified: Secondary | ICD-10-CM

## 2023-06-04 DIAGNOSIS — E119 Type 2 diabetes mellitus without complications: Secondary | ICD-10-CM

## 2023-06-04 NOTE — Telephone Encounter (Signed)
Medication Refill -  Most Recent Primary Care Visit:  Provider: Hoy Register  Department[facility]  Visit Type: OFFICE VISIT  Date: 12/25/2022  Medication:  metFORMIN (GLUCOPHAGE) 500 MG tablet  atorvastatin (LIPITOR) 40 MG tablet  ACCU-CHEK GUIDE test strip  Accu-Chek Softclix Lancets lancets  ibuprofen (ADVIL) 800 MG tablet  *Almost out of medication  Has the patient contacted their pharmacy? Yes, advised to contact PCP  Is this the correct pharmacy for this prescription? Yes, pharmacy listed below If no, delete pharmacy and type the correct one.  This is the patient's preferred pharmacy:  [facility] [address] [place] - [address] [place] [address] Phone: [phone] Fax: [phone]   Has the prescription been filled recently? Some have, some have not  Is the patient out of the medication? Patient is almost out of medication  Has the patient been seen for an appointment in the last year OR does the patient have an upcoming appointment? Yes. F/U scheduled with PCP on 07/02/2023

## 2023-06-05 MED ORDER — IBUPROFEN 800 MG PO TABS: 800.0000 mg | ORAL_TABLET | Freq: Three times a day (TID) | ORAL | 0 refills | Status: AC | PRN

## 2023-06-05 MED ORDER — ATORVASTATIN CALCIUM 40 MG PO TABS: 40.0000 mg | ORAL_TABLET | Freq: Every day | ORAL | 1 refills | Status: DC

## 2023-06-05 NOTE — Telephone Encounter (Signed)
Requested medication (s) are due for refill today: yes  Requested medication (s) are on the active medication list: yes  Last refill:  yes  Future visit scheduled: yes  Notes to clinic:  abnormal lab values   Requested Prescriptions  Pending Prescriptions Disp Refills   ibuprofen (ADVIL) 800 MG tablet 60 tablet     Sig: Take 1 tablet (800 mg total) by mouth 3 (three) times daily as needed. for pain     Analgesics:  NSAIDS Failed - 06/04/2023 12:11 PM      Failed - Manual Review: Labs are only required if the patient has taken medication for more than 8 weeks.      Failed - Cr in normal range and within 360 days    Creat  Date Value Ref Range Status  08/12/2016 0.49 (L) 0.50 - 1.10 mg/dL Final   Creatinine, Ser  Date Value Ref Range Status  12/25/2022 0.51 (L) 0.57 - 1.00 mg/dL Final         Failed - HGB in normal range and within 360 days    Hemoglobin  Date Value Ref Range Status  08/12/2016 15.1 11.7 - 15.5 g/dL Final         Failed - PLT in normal range and within 360 days    Platelets  Date Value Ref Range Status  08/12/2016 331 140 - 400 K/uL Final         Failed - HCT in normal range and within 360 days    HCT  Date Value Ref Range Status  08/12/2016 44.6 35.0 - 45.0 % Final         Passed - eGFR is 30 or above and within 360 days    GFR, Est African American  Date Value Ref Range Status  08/12/2016 >89 >=60 mL/min Final   GFR calc Af Amer  Date Value Ref Range Status  05/09/2020 126 >59 mL/min/1.73 Final    Comment:    **In accordance with recommendations from the NKF-ASN Task force,**   [facility] is in the process of updating its eGFR calculation to the   2021 CKD-EPI creatinine equation that estimates kidney function   without a race variable.    GFR, Est Non African American  Date Value Ref Range Status  08/12/2016 >89 >=60 mL/min Final   GFR calc non Af Amer  Date Value Ref Range Status  05/09/2020 109 >59 mL/min/1.73 Final   eGFR  Date  Value Ref Range Status  12/25/2022 112 >59 mL/min/1.73 Final         Passed - Patient is not pregnant      Passed - Valid encounter within last 12 months    Recent Outpatient Visits           5 months ago Type 2 diabetes mellitus with diabetic polyneuropathy, without long-term current use of insulin (HCC)   [place] [facility] - A Dept Of [place]. [facility] Hoy Register, MD   11 months ago Type 2 diabetes mellitus with diabetic polyneuropathy, without long-term current use of insulin (HCC)   [facility] [facility] [place] - A Dept Of [place]. [facility] Hoy Register, MD   1 year ago Type 2 diabetes mellitus with diabetic polyneuropathy, without long-term current use of insulin (HCC)   [facility] [place] - A Dept Of . [facility] Hoy Register, MD   1 year ago Type 2 diabetes mellitus with diabetic polyneuropathy, without long-term  current use of insulin (HCC)   [place] [facility] [place] - A Dept Of [place]. [facility] Hoy Register, MD   2 years ago Costochondritis   [place] [facility] [place] - A Dept Of [place]. [facility] Hoy Register, MD       Future Appointments             In 3 weeks Hoy Register, MD [facility] - A Dept Of [place]igha Bridegroom. [facility]            Signed Prescriptions Disp Refills   atorvastatin (LIPITOR) 40 MG tablet 90 tablet 1    Sig: Take 1 tablet (40 mg total) by mouth daily.     Cardiovascular:  Antilipid - Statins Failed - 06/04/2023 12:11 PM      Failed - Lipid Panel in normal range within the last 12 months    Cholesterol, Total  Date Value Ref Range Status  12/25/2022 180 100 - 199 mg/dL Final   LDL Chol Calc (NIH)  Date Value Ref Range Status  12/25/2022 103 (H) 0 - 99 mg/dL Final   HDL  Date Value Ref Range Status  12/25/2022 40 >39 mg/dL Final   Triglycerides  Date Value Ref Range Status   12/25/2022 216 (H) 0 - 149 mg/dL Final         Passed - Patient is not pregnant      Passed - Valid encounter within last 12 months    Recent Outpatient Visits           5 months ago Type 2 diabetes mellitus with diabetic polyneuropathy, without long-term current use of insulin (HCC)   [place] [facility] - A Dept Of [place]. [facility] Hoy Register, MD   11 months ago Type 2 diabetes mellitus with diabetic polyneuropathy, without long-term current use of insulin (HCC)   [place] [facility] [place] - A Dept Of [place]. [facility] Hoy Register, MD   1 year ago Type 2 diabetes mellitus with diabetic polyneuropathy, without long-term current use of insulin (HCC)   [place] [place] [facility] [place] - A Dept Of [place]. [facility] Hoy Register, MD   1 year ago Type 2 diabetes mellitus with diabetic polyneuropathy, without long-term current use of insulin (HCC)   [facility] [place] - A Dept Of [place]. [facility] Hoy Register, MD   2 years ago Costochondritis   [place] [facility] [place] - A Dept Of [place]. [facility] Hoy Register, MD       Future Appointments             In 3 weeks Hoy Register, MD [facility] - A Dept Of Eligha Bridegroom. [facility]            Refused Prescriptions Disp Refills   metFORMIN (GLUCOPHAGE) 500 MG tablet 90 tablet     Sig: Take 1 tablet (500 mg total) by mouth daily with breakfast.     Endocrinology:  Diabetes - Biguanides Failed - 06/04/2023 12:11 PM      Failed - Cr in normal range and within 360 days    Creat  Date Value Ref Range Status  08/12/2016 0.49 (L) 0.50 - 1.10 mg/dL Final   Creatinine, Ser  Date Value Ref Range Status  12/25/2022 0.51 (L) 0.57 - 1.00 mg/dL Final  Failed - B12 Level in normal range and within 720 days    No results found for: "VITAMINB12"       Failed - CBC within normal  limits and completed in the last 12 months    WBC  Date Value Ref Range Status  08/12/2016 6.2 3.8 - 10.8 K/uL Final   RBC  Date Value Ref Range Status  08/12/2016 4.87 3.80 - 5.10 MIL/uL Final   Hemoglobin  Date Value Ref Range Status  08/12/2016 15.1 11.7 - 15.5 g/dL Final   HCT  Date Value Ref Range Status  08/12/2016 44.6 35.0 - 45.0 % Final   MCHC  Date Value Ref Range Status  08/12/2016 33.9 32.0 - 36.0 g/dL Final   [facility]  Date Value Ref Range Status  08/12/2016 31.0 27.0 - 33.0 pg Final   MCV  Date Value Ref Range Status  08/12/2016 91.6 80.0 - 100.0 fL Final   No results found for: "PLTCOUNTKUC", "LABPLAT", "POCPLA" RDW  Date Value Ref Range Status  08/12/2016 12.9 11.0 - 15.0 % Final         Passed - HBA1C is between 0 and 7.9 and within 180 days    HbA1c, POC (controlled diabetic range)  Date Value Ref Range Status  12/25/2022 5.8 0.0 - 7.0 % Final         Passed - eGFR in normal range and within 360 days    GFR, Est African American  Date Value Ref Range Status  08/12/2016 >89 >=60 mL/min Final   GFR calc Af Amer  Date Value Ref Range Status  05/09/2020 126 >59 mL/min/1.73 Final    Comment:    **In accordance with recommendations from the NKF-ASN Task force,**   [facility] is in the process of updating its eGFR calculation to the   2021 CKD-EPI creatinine equation that estimates kidney function   without a race variable.    GFR, Est Non African American  Date Value Ref Range Status  08/12/2016 >89 >=60 mL/min Final   GFR calc non Af Amer  Date Value Ref Range Status  05/09/2020 109 >59 mL/min/1.73 Final   eGFR  Date Value Ref Range Status  12/25/2022 112 >59 mL/min/1.73 Final         Passed - Valid encounter within last 6 months    Recent Outpatient Visits           5 months ago Type 2 diabetes mellitus with diabetic polyneuropathy, without long-term current use of insulin (HCC)   [place] [facility] - A Dept Of [place].  [facility] Hoy Register, MD   11 months ago Type 2 diabetes mellitus with diabetic polyneuropathy, without long-term current use of insulin (HCC)   [place] [facility] [place] - A Dept Of [place]. [facility] Hoy Register, MD   1 year ago Type 2 diabetes mellitus with diabetic polyneuropathy, without long-term current use of insulin (HCC)   [place] [facility] [place] - A Dept Of [place]. [facility] Hoy Register, MD   1 year ago Type 2 diabetes mellitus with diabetic polyneuropathy, without long-term current use of insulin (HCC)   [place] [facility] [place] - A Dept Of [place]. [facility] Hoy Register, MD   2 years ago Costochondritis    [facility] [place] - A Dept Of [place]. [facility] Hoy Register, MD       Future Appointments  In 3 weeks Hoy Register, MD [facility] [place] - A Dept Of [place]. [facility]             glucose blood (ACCU-CHEK GUIDE) test strip 100 each     Sig: Use as instructed     Endocrinology: Diabetes - Testing Supplies Passed - 06/04/2023 12:11 PM      Passed - Valid encounter within last 12 months    Recent Outpatient Visits           5 months ago Type 2 diabetes mellitus with diabetic polyneuropathy, without long-term current use of insulin (HCC)   [place] [facility] [place] - A Dept Of [place]. [facility] Hoy Register, MD   11 months ago Type 2 diabetes mellitus with diabetic polyneuropathy, without long-term current use of insulin (HCC)   [place] [facility] [place] - A Dept Of [place]. [facility] Hoy Register, MD   1 year ago Type 2 diabetes mellitus with diabetic polyneuropathy, without long-term current use of insulin (HCC)   [place] [facility] [place] - A Dept Of [place]. [facility] Hoy Register, MD   1 year ago Type 2 diabetes mellitus with diabetic  polyneuropathy, without long-term current use of insulin (HCC)   [place] [facility] [place] - A Dept Of [place]. [facility] Hoy Register, MD   2 years ago Costochondritis   [place] [facility] [place] - A Dept Of [place]. [facility] Hoy Register, MD       Future Appointments             In 3 weeks Hoy Register, MD [facility] - A Dept Of Eligha Bridegroom. [facility]             Accu-Chek Softclix Lancets lancets 100 each     Sig: Use as instructed     Endocrinology: Diabetes - Testing Supplies Passed - 06/04/2023 12:11 PM      Passed - Valid encounter within last 12 months    Recent Outpatient Visits           5 months ago Type 2 diabetes mellitus with diabetic polyneuropathy, without long-term current use of insulin (HCC)   [place] [facility] [place] - A Dept Of [place]. [facility] Hoy Register, MD   11 months ago Type 2 diabetes mellitus with diabetic polyneuropathy, without long-term current use of insulin (HCC)   [place] [facility] [place] - A Dept Of [place]. [facility] Hoy Register, MD   1 year ago Type 2 diabetes mellitus with diabetic polyneuropathy, without long-term current use of insulin (HCC)   [place] [facility] [place] - A Dept Of [place]. [facility] Hoy Register, MD   1 year ago Type 2 diabetes mellitus with diabetic polyneuropathy, without long-term current use of insulin (HCC)   [place] [facility] [place] - A Dept Of [place]. [facility] Hoy Register, MD   2 years ago Costochondritis   [place] [facility] [place] - A Dept Of [place]. [facility] Hoy Register, MD       Future Appointments             In 3 weeks Hoy Register, MD [facility] [place] - A Dept Of Eligha Bridegroom. [facility]

## 2023-06-05 NOTE — Telephone Encounter (Signed)
Requested Prescriptions  Pending Prescriptions Disp Refills   metFORMIN (GLUCOPHAGE) 500 MG tablet 90 tablet     Sig: Take 1 tablet (500 mg total) by mouth daily with breakfast.     Endocrinology:  Diabetes - Biguanides Failed - 06/04/2023 12:11 PM      Failed - Cr in normal range and within 360 days    Creat  Date Value Ref Range Status  08/12/2016 0.49 (L) 0.50 - 1.10 mg/dL Final   Creatinine, Ser  Date Value Ref Range Status  12/25/2022 0.51 (L) 0.57 - 1.00 mg/dL Final         Failed - B12 Level in normal range and within 720 days    No results found for: "VITAMINB12"       Failed - CBC within normal limits and completed in the last 12 months    WBC  Date Value Ref Range Status  08/12/2016 6.2 3.8 - 10.8 K/uL Final   RBC  Date Value Ref Range Status  08/12/2016 4.87 3.80 - 5.10 MIL/uL Final   Hemoglobin  Date Value Ref Range Status  08/12/2016 15.1 11.7 - 15.5 g/dL Final   HCT  Date Value Ref Range Status  08/12/2016 44.6 35.0 - 45.0 % Final   MCHC  Date Value Ref Range Status  08/12/2016 33.9 32.0 - 36.0 g/dL Final   [facility]  Date Value Ref Range Status  08/12/2016 31.0 27.0 - 33.0 pg Final   MCV  Date Value Ref Range Status  08/12/2016 91.6 80.0 - 100.0 fL Final   No results found for: "PLTCOUNTKUC", "LABPLAT", "POCPLA" RDW  Date Value Ref Range Status  08/12/2016 12.9 11.0 - 15.0 % Final         Passed - HBA1C is between 0 and 7.9 and within 180 days    HbA1c, POC (controlled diabetic range)  Date Value Ref Range Status  12/25/2022 5.8 0.0 - 7.0 % Final         Passed - eGFR in normal range and within 360 days    GFR, Est African American  Date Value Ref Range Status  08/12/2016 >89 >=60 mL/min Final   GFR calc Af Amer  Date Value Ref Range Status  05/09/2020 126 >59 mL/min/1.73 Final    Comment:    **In accordance with recommendations from the NKF-ASN Task force,**   [facility] is in the process of updating its eGFR calculation to the   2021  CKD-EPI creatinine equation that estimates kidney function   without a race variable.    GFR, Est Non African American  Date Value Ref Range Status  08/12/2016 >89 >=60 mL/min Final   GFR calc non Af Amer  Date Value Ref Range Status  05/09/2020 109 >59 mL/min/1.73 Final   eGFR  Date Value Ref Range Status  12/25/2022 112 >59 mL/min/1.73 Final         Passed - Valid encounter within last 6 months    Recent Outpatient Visits           5 months ago Type 2 diabetes mellitus with diabetic polyneuropathy, without long-term current use of insulin (HCC)   [facility] - [facility] Dept Of [place]. [facility] Hoy Register, MD   11 months ago Type 2 diabetes mellitus with diabetic polyneuropathy, without long-term current use of insulin (HCC)   [facility] [place] - A Dept Of [place]. [facility] Hoy Register, MD   1 year ago Type 2  diabetes mellitus with diabetic polyneuropathy, without long-term current use of insulin (HCC)   [facility] - A Dept Of [place]. [facility] Hoy Register, MD   1 year ago Type 2 diabetes mellitus with diabetic polyneuropathy, without long-term current use of insulin (HCC)   [place] [place] [facility] [place] - A Dept Of [place]. [facility] Hoy Register, MD   2 years ago Costochondritis   [facility] [place] - A Dept Of [place]. [facility] Hoy Register, MD       Future Appointments             In 3 weeks Hoy Register, MD [facility] - A Dept Of Eligha Bridegroom. [facility]             atorvastatin (LIPITOR) 40 MG tablet 90 tablet 1    Sig: Take 1 tablet (40 mg total) by mouth daily.     Cardiovascular:  Antilipid - Statins Failed - 06/04/2023 12:11 PM      Failed - Lipid Panel in normal range within the last 12 months    Cholesterol, Total  Date Value Ref Range Status  12/25/2022 180 100 - 199  mg/dL Final   LDL Chol Calc (NIH)  Date Value Ref Range Status  12/25/2022 103 (H) 0 - 99 mg/dL Final   HDL  Date Value Ref Range Status  12/25/2022 40 >39 mg/dL Final   Triglycerides  Date Value Ref Range Status  12/25/2022 216 (H) 0 - 149 mg/dL Final         Passed - Patient is not pregnant      Passed - Valid encounter within last 12 months    Recent Outpatient Visits           5 months ago Type 2 diabetes mellitus with diabetic polyneuropathy, without long-term current use of insulin (HCC)   [place] [facility] - A Dept Of [place]. [facility] Hoy Register, MD   11 months ago Type 2 diabetes mellitus with diabetic polyneuropathy, without long-term current use of insulin (HCC)   [facility] [facility] [place] - A Dept Of [place]. [facility] Hoy Register, MD   1 year ago Type 2 diabetes mellitus with diabetic polyneuropathy, without long-term current use of insulin (HCC)   [facility] [facility] [place] - A Dept Of [place]. [facility] Hoy Register, MD   1 year ago Type 2 diabetes mellitus with diabetic polyneuropathy, without long-term current use of insulin (HCC)   [facility] [place] - A Dept Of [place]. [facility] Hoy Register, MD   2 years ago Costochondritis   [place] [facility] [place] - A Dept Of . [facility] Hoy Register, MD       Future Appointments             In 3 weeks Hoy Register, MD [facility] [place] - A Dept Of Eligha Bridegroom. [facility]             glucose blood (ACCU-CHEK GUIDE) test strip 100 each     Sig: Use as instructed     Endocrinology: Diabetes - Testing Supplies Passed - 06/04/2023 12:11 PM      Passed - Valid encounter within last 12 months    Recent Outpatient Visits  5 months ago Type 2 diabetes mellitus with diabetic polyneuropathy, without long-term current use of insulin (HCC)   [facility] [place] - A Dept Of [place]. [facility] Hoy Register, MD   11 months ago Type 2 diabetes mellitus with diabetic polyneuropathy, without long-term current use of insulin (HCC)   [facility] [facility] [place] - A Dept Of [place]. [facility] Hoy Register, MD   1 year ago Type 2 diabetes mellitus with diabetic polyneuropathy, without long-term current use of insulin (HCC)   [facility] [facility] [place] - A Dept Of [place]. [facility] Hoy Register, MD   1 year ago Type 2 diabetes mellitus with diabetic polyneuropathy, without long-term current use of insulin (HCC)   [place] [facility] [place] - A Dept Of [place]. [facility] Hoy Register, MD   2 years ago Costochondritis   [place] [facility] [place] - A Dept Of [place]. [facility] Hoy Register, MD       Future Appointments             In 3 weeks Hoy Register, MD [facility] [place] - A Dept Of Eligha Bridegroom. [facility]             Accu-Chek Softclix Lancets lancets 100 each     Sig: Use as instructed     Endocrinology: Diabetes - Testing Supplies Passed - 06/04/2023 12:11 PM      Passed - Valid encounter within last 12 months    Recent Outpatient Visits           5 months ago Type 2 diabetes mellitus with diabetic polyneuropathy, without long-term current use of insulin (HCC)   [place] [facility] [place] - A Dept Of [place]. [facility] Hoy Register, MD   11 months ago Type 2 diabetes mellitus with diabetic polyneuropathy, without long-term current use of insulin (HCC)   [facility] [place] [facility] [place] - A Dept Of [place]. [facility] Hoy Register, MD   1 year ago Type 2 diabetes mellitus with diabetic polyneuropathy, without long-term current use of insulin (HCC)   [place] [facility] [place] - A Dept Of [place]. [facility] Hoy Register, MD   1 year ago  Type 2 diabetes mellitus with diabetic polyneuropathy, without long-term current use of insulin (HCC)   [place] [facility] [place] - A Dept Of [place]. [facility] Hoy Register, MD   2 years ago Costochondritis   [facility] [facility] [place] - A Dept Of [place]. [facility] Hoy Register, MD       Future Appointments             In 3 weeks Hoy Register, MD [facility] [place] - A Dept Of Eligha Bridegroom. [facility]             ibuprofen (ADVIL) 800 MG tablet 60 tablet     Sig: Take 1 tablet (800 mg total) by mouth 3 (three) times daily as needed. for pain     Analgesics:  NSAIDS Failed - 06/04/2023 12:11 PM      Failed - Manual Review: Labs are only required if the patient has taken medication for more than 8 weeks.      Failed - Cr in normal range and within 360 days    Creat  Date Value Ref  Range Status  08/12/2016 0.49 (L) 0.50 - 1.10 mg/dL Final   Creatinine, Ser  Date Value Ref Range Status  12/25/2022 0.51 (L) 0.57 - 1.00 mg/dL Final         Failed - HGB in normal range and within 360 days    Hemoglobin  Date Value Ref Range Status  08/12/2016 15.1 11.7 - 15.5 g/dL Final         Failed - PLT in normal range and within 360 days    Platelets  Date Value Ref Range Status  08/12/2016 331 140 - 400 K/uL Final         Failed - HCT in normal range and within 360 days    HCT  Date Value Ref Range Status  08/12/2016 44.6 35.0 - 45.0 % Final         Passed - eGFR is 30 or above and within 360 days    GFR, Est African American  Date Value Ref Range Status  08/12/2016 >89 >=60 mL/min Final   GFR calc Af Amer  Date Value Ref Range Status  05/09/2020 126 >59 mL/min/1.73 Final    Comment:    **In accordance with recommendations from the NKF-ASN Task force,**   [facility] is in the process of updating its eGFR calculation to the   2021 CKD-EPI creatinine equation that estimates kidney function   without a race  variable.    GFR, Est Non African American  Date Value Ref Range Status  08/12/2016 >89 >=60 mL/min Final   GFR calc non Af Amer  Date Value Ref Range Status  05/09/2020 109 >59 mL/min/1.73 Final   eGFR  Date Value Ref Range Status  12/25/2022 112 >59 mL/min/1.73 Final         Passed - Patient is not pregnant      Passed - Valid encounter within last 12 months    Recent Outpatient Visits           5 months ago Type 2 diabetes mellitus with diabetic polyneuropathy, without long-term current use of insulin (HCC)   [place] [facility] - A Dept Of [place]. [facility] Hoy Register, MD   11 months ago Type 2 diabetes mellitus with diabetic polyneuropathy, without long-term current use of insulin (HCC)   [place] [facility] [place] - A Dept Of [place]. [facility] Hoy Register, MD   1 year ago Type 2 diabetes mellitus with diabetic polyneuropathy, without long-term current use of insulin (HCC)   [place] - A Dept Of [place]. [facility] Hoy Register, MD   1 year ago Type 2 diabetes mellitus with diabetic polyneuropathy, without long-term current use of insulin (HCC)   [facility] [place] - A Dept Of [place]. [facility] Hoy Register, MD   2 years ago Costochondritis   [place] [facility] [place] - A Dept Of [place]. [facility] Hoy Register, MD       Future Appointments             In 3 weeks Hoy Register, MD [facility] [place] - A Dept Of Eligha Bridegroom. [facility]

## 2023-06-05 NOTE — Telephone Encounter (Signed)
Requested Prescriptions  Pending Prescriptions Disp Refills   atorvastatin (LIPITOR) 40 MG tablet 90 tablet 1    Sig: Take 1 tablet (40 mg total) by mouth daily.     Cardiovascular:  Antilipid - Statins Failed - 06/04/2023 12:11 PM      Failed - Lipid Panel in normal range within the last 12 months    Cholesterol, Total  Date Value Ref Range Status  12/25/2022 180 100 - 199 mg/dL Final   LDL Chol Calc (NIH)  Date Value Ref Range Status  12/25/2022 103 (H) 0 - 99 mg/dL Final   HDL  Date Value Ref Range Status  12/25/2022 40 >39 mg/dL Final   Triglycerides  Date Value Ref Range Status  12/25/2022 216 (H) 0 - 149 mg/dL Final         Passed - Patient is not pregnant      Passed - Valid encounter within last 12 months    Recent Outpatient Visits           5 months ago Type 2 diabetes mellitus with diabetic polyneuropathy, without long-term current use of insulin (HCC)   [place] [facility] - A Dept Of [place]. [facility] Hoy Register, MD   11 months ago Type 2 diabetes mellitus with diabetic polyneuropathy, without long-term current use of insulin (HCC)   [place] [facility] [place] - A Dept Of [place]. [facility] Hoy Register, MD   1 year ago Type 2 diabetes mellitus with diabetic polyneuropathy, without long-term current use of insulin (HCC)   [facility] [place] [facility] [place] - A Dept Of [place]. [facility] Hoy Register, MD   1 year ago Type 2 diabetes mellitus with diabetic polyneuropathy, without long-term current use of insulin (HCC)   [facility] [facility] [place] - A Dept Of [place]. [facility] Hoy Register, MD   2 years ago Costochondritis   [facility] [place] - A Dept Of [place]. [facility] Hoy Register, MD       Future Appointments             In 3 weeks Hoy Register, MD [facility] - A Dept Of Eligha Bridegroom. [facility]              ibuprofen (ADVIL) 800 MG tablet 60 tablet     Sig: Take 1 tablet (800 mg total) by mouth 3 (three) times daily as needed. for pain     Analgesics:  NSAIDS Failed - 06/04/2023 12:11 PM      Failed - Manual Review: Labs are only required if the patient has taken medication for more than 8 weeks.      Failed - Cr in normal range and within 360 days    Creat  Date Value Ref Range Status  08/12/2016 0.49 (L) 0.50 - 1.10 mg/dL Final   Creatinine, Ser  Date Value Ref Range Status  12/25/2022 0.51 (L) 0.57 - 1.00 mg/dL Final         Failed - HGB in normal range and within 360 days    Hemoglobin  Date Value Ref Range Status  08/12/2016 15.1 11.7 - 15.5 g/dL Final         Failed - PLT in normal range and within 360 days    Platelets  Date Value Ref Range Status  08/12/2016 331 140 - 400 K/uL Final  Failed - HCT in normal range and within 360 days    HCT  Date Value Ref Range Status  08/12/2016 44.6 35.0 - 45.0 % Final         Passed - eGFR is 30 or above and within 360 days    GFR, Est African American  Date Value Ref Range Status  08/12/2016 >89 >=60 mL/min Final   GFR calc Af Amer  Date Value Ref Range Status  05/09/2020 126 >59 mL/min/1.73 Final    Comment:    **In accordance with recommendations from the NKF-ASN Task force,**   [facility] is in the process of updating its eGFR calculation to the   2021 CKD-EPI creatinine equation that estimates kidney function   without a race variable.    GFR, Est Non African American  Date Value Ref Range Status  08/12/2016 >89 >=60 mL/min Final   GFR calc non Af Amer  Date Value Ref Range Status  05/09/2020 109 >59 mL/min/1.73 Final   eGFR  Date Value Ref Range Status  12/25/2022 112 >59 mL/min/1.73 Final         Passed - Patient is not pregnant      Passed - Valid encounter within last 12 months    Recent Outpatient Visits           5 months ago Type 2 diabetes mellitus with diabetic polyneuropathy, without  long-term current use of insulin (HCC)   [facility] [facility] - A Dept Of [place]. [facility] Hoy Register, MD   11 months ago Type 2 diabetes mellitus with diabetic polyneuropathy, without long-term current use of insulin (HCC)   [facility] [facility] - A Dept Of [place]. [facility] Hoy Register, MD   1 year ago Type 2 diabetes mellitus with diabetic polyneuropathy, without long-term current use of insulin (HCC)   [facility] [facility] [place] - A Dept Of [place]. [facility] Hoy Register, MD   1 year ago Type 2 diabetes mellitus with diabetic polyneuropathy, without long-term current use of insulin (HCC)   [place] [facility] [place] - A Dept Of [place]. [facility] Hoy Register, MD   2 years ago Costochondritis    [facility] [place] - A Dept Of [place]. [facility] Hoy Register, MD       Future Appointments             In 3 weeks Hoy Register, MD [facility] [place] - A Dept Of Eligha Bridegroom. [facility]            Refused Prescriptions Disp Refills   metFORMIN (GLUCOPHAGE) 500 MG tablet 90 tablet     Sig: Take 1 tablet (500 mg total) by mouth daily with breakfast.     Endocrinology:  Diabetes - Biguanides Failed - 06/04/2023 12:11 PM      Failed - Cr in normal range and within 360 days    Creat  Date Value Ref Range Status  08/12/2016 0.49 (L) 0.50 - 1.10 mg/dL Final   Creatinine, Ser  Date Value Ref Range Status  12/25/2022 0.51 (L) 0.57 - 1.00 mg/dL Final         Failed - B12 Level in normal range and within 720 days    No results found for: "VITAMINB12"       Failed - CBC within normal limits and completed in the last 12 months    WBC  Date Value Ref Range Status  08/12/2016 6.2 3.8 - 10.8 K/uL Final   RBC  Date Value Ref Range Status  08/12/2016 4.87 3.80 - 5.10 MIL/uL Final   Hemoglobin  Date Value Ref Range Status  08/12/2016  15.1 11.7 - 15.5 g/dL Final   HCT  Date Value Ref Range Status  08/12/2016 44.6 35.0 - 45.0 % Final   MCHC  Date Value Ref Range Status  08/12/2016 33.9 32.0 - 36.0 g/dL Final   [facility]  Date Value Ref Range Status  08/12/2016 31.0 27.0 - 33.0 pg Final   MCV  Date Value Ref Range Status  08/12/2016 91.6 80.0 - 100.0 fL Final   No results found for: "PLTCOUNTKUC", "LABPLAT", "POCPLA" RDW  Date Value Ref Range Status  08/12/2016 12.9 11.0 - 15.0 % Final         Passed - HBA1C is between 0 and 7.9 and within 180 days    HbA1c, POC (controlled diabetic range)  Date Value Ref Range Status  12/25/2022 5.8 0.0 - 7.0 % Final         Passed - eGFR in normal range and within 360 days    GFR, Est African American  Date Value Ref Range Status  08/12/2016 >89 >=60 mL/min Final   GFR calc Af Amer  Date Value Ref Range Status  05/09/2020 126 >59 mL/min/1.73 Final    Comment:    **In accordance with recommendations from the NKF-ASN Task force,**   [facility] is in the process of updating its eGFR calculation to the   2021 CKD-EPI creatinine equation that estimates kidney function   without a race variable.    GFR, Est Non African American  Date Value Ref Range Status  08/12/2016 >89 >=60 mL/min Final   GFR calc non Af Amer  Date Value Ref Range Status  05/09/2020 109 >59 mL/min/1.73 Final   eGFR  Date Value Ref Range Status  12/25/2022 112 >59 mL/min/1.73 Final         Passed - Valid encounter within last 6 months    Recent Outpatient Visits           5 months ago Type 2 diabetes mellitus with diabetic polyneuropathy, without long-term current use of insulin (HCC)   [place] [facility] - A Dept Of [place]. [facility] Hoy Register, MD   11 months ago Type 2 diabetes mellitus with diabetic polyneuropathy, without long-term current use of insulin (HCC)   [facility] [place] - A Dept Of [place]. [facility] Hoy Register, MD    1 year ago Type 2 diabetes mellitus with diabetic polyneuropathy, without long-term current use of insulin (HCC)   [place] [facility] [place] - A Dept Of [place]. [facility] Hoy Register, MD   1 year ago Type 2 diabetes mellitus with diabetic polyneuropathy, without long-term current use of insulin (HCC)   [place] [facility] [place] - A Dept Of [place]. [facility] Hoy Register, MD   2 years ago Costochondritis   [place] [facility] [place] - A Dept Of [place]. [facility] Hoy Register, MD       Future Appointments             In 3 weeks Hoy Register, MD [facility] [place] - A Dept Of Eligha Bridegroom. [facility]             glucose blood (ACCU-CHEK  GUIDE) test strip 100 each     Sig: Use as instructed     Endocrinology: Diabetes - Testing Supplies Passed - 06/04/2023 12:11 PM      Passed - Valid encounter within last 12 months    Recent Outpatient Visits           5 months ago Type 2 diabetes mellitus with diabetic polyneuropathy, without long-term current use of insulin (HCC)   [place] [facility] - A Dept Of [place]. [facility] Hoy Register, MD   11 months ago Type 2 diabetes mellitus with diabetic polyneuropathy, without long-term current use of insulin (HCC)   [facility] [facility] - A Dept Of [place]. [facility] Hoy Register, MD   1 year ago Type 2 diabetes mellitus with diabetic polyneuropathy, without long-term current use of insulin (HCC)   [place] [facility] [place] - A Dept Of [place]. [facility] Hoy Register, MD   1 year ago Type 2 diabetes mellitus with diabetic polyneuropathy, without long-term current use of insulin (HCC)   [facility] [facility] [place] - A Dept Of [place]. [facility] Hoy Register, MD   2 years ago Costochondritis   [place] [facility] [place] - A Dept Of [place]. [facility] Hoy Register, MD       Future Appointments             In 3 weeks Hoy Register, MD [facility] [place] - A Dept Of Eligha Bridegroom. [facility]             Accu-Chek Softclix Lancets lancets 100 each     Sig: Use as instructed     Endocrinology: Diabetes - Testing Supplies Passed - 06/04/2023 12:11 PM      Passed - Valid encounter within last 12 months    Recent Outpatient Visits           5 months ago Type 2 diabetes mellitus with diabetic polyneuropathy, without long-term current use of insulin (HCC)   [place] [facility] [place] - A Dept Of [place]. [facility] Hoy Register, MD   11 months ago Type 2 diabetes mellitus with diabetic polyneuropathy, without long-term current use of insulin (HCC)   [place] [facility] [place] - A Dept Of [place]. [facility] Hoy Register, MD   1 year ago Type 2 diabetes mellitus with diabetic polyneuropathy, without long-term current use of insulin (HCC)   [place] [facility] [place] - A Dept Of [place]. [facility] Hoy Register, MD   1 year ago Type 2 diabetes mellitus with diabetic polyneuropathy, without long-term current use of insulin (HCC)   [place] [facility] [place] - A Dept Of [place]. [facility] Hoy Register, MD   2 years ago Costochondritis    [facility] [place] - A Dept Of . [facility] Hoy Register, MD       Future Appointments             In 3 weeks Hoy Register, MD [facility] - A Dept Of Eligha Bridegroom. [facility]

## 2023-07-02 ENCOUNTER — Encounter: Payer: Self-pay | Admitting: Family Medicine

## 2023-07-02 ENCOUNTER — Ambulatory Visit: Payer: Medicaid Other | Attending: Family Medicine | Admitting: Family Medicine

## 2023-07-02 VITALS — BP 137/80 | HR 68 | Ht 61.0 in | Wt 151.8 lb

## 2023-07-02 DIAGNOSIS — Z7984 Long term (current) use of oral hypoglycemic drugs: Secondary | ICD-10-CM

## 2023-07-02 DIAGNOSIS — E1142 Type 2 diabetes mellitus with diabetic polyneuropathy: Secondary | ICD-10-CM

## 2023-07-02 DIAGNOSIS — E1169 Type 2 diabetes mellitus with other specified complication: Secondary | ICD-10-CM | POA: Diagnosis not present

## 2023-07-02 DIAGNOSIS — E042 Nontoxic multinodular goiter: Secondary | ICD-10-CM

## 2023-07-02 DIAGNOSIS — E785 Hyperlipidemia, unspecified: Secondary | ICD-10-CM

## 2023-07-02 DIAGNOSIS — Z23 Encounter for immunization: Secondary | ICD-10-CM | POA: Diagnosis not present

## 2023-07-02 LAB — POCT GLYCOSYLATED HEMOGLOBIN (HGB A1C): HbA1c, POC (controlled diabetic range): 7.7 % — AB (ref 0.0–7.0)

## 2023-07-02 MED ORDER — METFORMIN HCL 500 MG PO TABS: 1000.0000 mg | ORAL_TABLET | Freq: Two times a day (BID) | ORAL | 1 refills | Status: DC

## 2023-07-02 NOTE — Progress Notes (Signed)
Subjective:  Patient ID: Debbie Lawson, female    DOB: 1970-09-11  Age: 52 y.o. MRN: [id number]  CC: Medical Management of Chronic Issues   HPI Debbie Lawson is a 52 y.o. 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  She presents for a routine follow-up visit. She reports no new concerns or symptoms since her last visit six months ago. She is due for an eye exam, which she plans to schedule soon. She also mentions that she found a previously misplaced kit for a stool test for Colon ca screening, which she plans to send in for analysis.  Her A1c is 7.7 up from 5.8 previously. The patient acknowledges some dietary indiscretions during the holiday season, which may have impacted her diabetes control.  She endorses adherence with her statin. In 07/2022 she had a visit with ENT for follow up pf her thyroid nodules and per notes the plan was to repeat a thyroid Ultrasound in 1 year due to sonographic evidence of regression of the concerning nodule.     Past Medical History:  Diagnosis Date   Hyperlipidemia 04/15/2017    Past Surgical History:  Procedure Laterality Date   NO PAST SURGERIES      Family History  Problem Relation Age of Onset   Diabetes Mother    Breast cancer Sister 31    Social History   Socioeconomic History   Marital status: Married    Spouse name: Not on file   Number of children: Not on file   Years of education: Not on file   Highest education level: Not on file  Occupational History   Not on file  Tobacco Use   Smoking status: Never   Smokeless tobacco: Never  Vaping Use   Vaping status: Never Used  Substance and Sexual Activity   Alcohol use: No   Drug use: No    Sexual activity: Yes  Other Topics Concern   Not on file  Social History Narrative   Not on file   Social Determinants of Health   Financial Resource Strain: Not on file  Food Insecurity: Not on file  Transportation Needs: Not on file  Physical Activity: Not on file  Stress: Not on file  Social Connections: Not on file    No Known Allergies  Outpatient Medications Prior to Visit  Medication Sig Dispense Refill   ACCU-CHEK GUIDE test strip USE 1 STRIP TO CHECK GLUCOSE ONCE DAILY BEFORE BREAKFAST 100 each 0   Accu-Chek Softclix Lancets lancets USE AS DIRECTED **  NEED  OFFICE  REFILLS  FOR  MORE  REFILLS** 100 each 0   atorvastatin (LIPITOR) 40 MG tablet Take 1 tablet (40 mg total) by mouth daily. 90 tablet 1   Blood Glucose Monitoring Suppl (ACCU-CHEK AVIVA) device Use as instructed daily. 1 each 0   Blood Glucose Monitoring Suppl (ACCU-CHEK GUIDE ME) w/Device KIT Use to check blood sugar one daily. E11.9 1 kit 0   Cholecalciferol (VITAMIN D PO) Take 1 tablet by mouth daily.     ibuprofen (ADVIL) 800 MG tablet Take 1 tablet (800 mg total) by mouth 3 (three) times daily as needed. for pain 60 tablet 0   metFORMIN (GLUCOPHAGE) 500 MG tablet Take 1  tablet by mouth once daily with breakfast 90 tablet 0   clotrimazole (LOTRIMIN) 1 % cream Apply 1 application topically 2 (two) times daily. (Patient not taking: Reported on 12/25/2022) 45 g 1   diclofenac Sodium (VOLTAREN) 1 % GEL Apply 2 g topically 4 (four) times daily. (Patient not taking: Reported on 12/25/2022) 100 g 1   No facility-administered medications prior to visit.     ROS Review of Systems  Constitutional:  Negative for activity change and appetite change.  HENT:  Negative for sinus pressure and sore throat.   Respiratory:  Negative for chest tightness, shortness of breath and wheezing.   Cardiovascular:  Negative for chest pain and palpitations.  Gastrointestinal:  Negative for abdominal distention, abdominal pain and  constipation.  Genitourinary: Negative.   Musculoskeletal: Negative.   Psychiatric/Behavioral:  Negative for behavioral problems and dysphoric mood.     Objective:  BP 137/80   Pulse 68   Ht 5\' 1"  (1.549 m)   Wt 151 lb 12.8 oz (68.9 kg)   SpO2 100%   BMI 28.68 kg/m      07/02/2023    9:41 AM 12/25/2022    9:21 AM 06/25/2022   11:33 AM  BP/Weight  Systolic BP 137 131 128  Diastolic BP 80 75 69  Wt. (Lbs) 151.8 147.8 145  BMI 28.68 kg/m2 27.93 kg/m2 27.4 kg/m2      Physical Exam Constitutional:      Appearance: She is well-developed.  Cardiovascular:     Rate and Rhythm: Normal rate.     Heart sounds: Normal heart sounds. No murmur heard. Pulmonary:     Effort: Pulmonary effort is normal.     Breath sounds: Normal breath sounds. No wheezing or rales.  Chest:     Chest wall: No tenderness.  Abdominal:     General: Bowel sounds are normal. There is no distension.     Palpations: Abdomen is soft. There is no mass.     Tenderness: There is no abdominal tenderness.  Musculoskeletal:        General: Normal range of motion.     Right lower leg: No edema.     Left lower leg: No edema.  Neurological:     Mental Status: She is alert and oriented to person, place, and time.  Psychiatric:        Mood and Affect: Mood normal.        Latest Ref Rng & Units 12/25/2022    9:58 AM 03/26/2022   12:30 PM 07/04/2021   10:36 AM  CMP  Glucose 70 - 99 mg/dL 202  542  706   BUN 6 - 24 mg/dL 9  9  9    Creatinine 0.57 - 1.00 mg/dL 2.37  6.28  3.15   Sodium 134 - 144 mmol/L 141  139  139   Potassium 3.5 - 5.2 mmol/L 4.8  4.4  4.3   Chloride 96 - 106 mmol/L 103  104  102   CO2 20 - 29 mmol/L 20  19  22    Calcium 8.7 - 10.2 mg/dL 9.4  9.2  9.4   Total Protein 6.0 - 8.5 g/dL 6.8  7.1  7.3   Total Bilirubin 0.0 - 1.2 mg/dL <1.7  0.6  0.6   Alkaline Phos 44 - 121 IU/L 68  66  58   AST 0 - 40 IU/L 15  17  14    ALT 0 - 32 IU/L 9  12  11  Lipid Panel     Component Value Date/Time    CHOL 180 12/25/2022 0958   TRIG 216 (H) 12/25/2022 0958   HDL 40 12/25/2022 0958   CHOLHDL 3.3 01/19/2021 0950   CHOLHDL 5.4 (H) 08/12/2016 0932   VLDL 27 07/11/2010 2115   LDLCALC 103 (H) 12/25/2022 0958    CBC    Component Value Date/Time   WBC 6.2 08/12/2016 0932   RBC 4.87 08/12/2016 0932   HGB 15.1 08/12/2016 0932   HCT 44.6 08/12/2016 0932   PLT 331 08/12/2016 0932   MCV 91.6 08/12/2016 0932   MCH 31.0 08/12/2016 0932   MCHC 33.9 08/12/2016 0932   RDW 12.9 08/12/2016 0932   LYMPHSABS 2,170 08/12/2016 0932   MONOABS 558 08/12/2016 0932   EOSABS 124 08/12/2016 0932   BASOSABS 0 08/12/2016 0932    Lab Results  Component Value Date   HGBA1C 7.7 (A) 07/02/2023    Assessment & Plan:      Type 2 Diabetes Mellitus A1c increased from 5.8 to 7.7, likely due to dietary changes during the holiday season. -Increase Metformin from 500mg  daily to 1000mg  daily. -Check blood work today including kidney and liver function. -Counseled on Diabetic diet, my plate method, 086 minutes of moderate intensity exercise/week Blood sugar logs with fasting goals of 80-120 mg/dl, random of less than 578 and in the event of sugars less than 60 mg/dl or greater than 469 mg/dl encouraged to notify the clinic. Advised on the need for annual eye exams, annual foot exams, Pneumonia vaccine.   Hyperlipidemia Previous increase in LDL cholesterol up to 103 above goal of <100 -Check lipid panel today to determine if adjustment in cholesterol medication is needed.  Colon Cancer Screening Patient has home stool test kit that was misplaced but recently found. -Instructed patient to complete and return stool test kit.  Diabetic Retinopathy Screening Patient due for annual eye exam. -Patient to schedule appointment with usual eye doctor.  Influenza Vaccination Due for annual vaccination. -Administer flu shot today.          Meds ordered this encounter  Medications   metFORMIN  (GLUCOPHAGE) 500 MG tablet    Sig: Take 2 tablets (1,000 mg total) by mouth 2 (two) times daily with a meal.    Dispense:  180 tablet    Refill:  1    Dose increase    Follow-up: Return in about 6 months (around 12/31/2023) for Chronic medical conditions.       Hoy Register, MD, FAAFP. [facility] [place], [place] [phone]   07/02/2023, 12:31 PM

## 2023-07-02 NOTE — Patient Instructions (Signed)
Diabetes mellitus y nutricin, en adultos Diabetes Mellitus and Nutrition, Adult Si sufre de diabetes, o diabetes mellitus, es muy importante tener hbitos alimenticios saludables debido a que sus niveles de azcar en la sangre (glucosa) se ven afectados en gran medida por lo que come y bebe. Comer alimentos saludables en las cantidades correctas, aproximadamente a la misma hora todos los das, lo ayudar a: Controlar su glucemia. Disminuir el riesgo de sufrir una enfermedad cardaca. Mejorar la presin arterial. Alcanzar o mantener un peso saludable. Qu puede afectar mi plan de alimentacin? Todas las personas que sufren de diabetes son diferentes y cada una tiene necesidades diferentes en cuanto a un plan de alimentacin. El mdico puede recomendarle que trabaje con un nutricionista para elaborar el mejor plan para usted. Su plan de alimentacin puede variar segn factores como: Las caloras que necesita. Los medicamentos que toma. Su peso. Sus niveles de glucemia, presin arterial y colesterol. Su nivel de actividad. Otras afecciones que tenga, como enfermedades cardacas o renales. Cmo me afectan los carbohidratos? Los carbohidratos, o hidratos de carbono, afectan su nivel de glucemia ms que cualquier otro tipo de alimento. La ingesta de carbohidratos aumenta la cantidad de glucosa en la sangre. Es importante conocer la cantidad de carbohidratos que se pueden ingerir en cada comida sin correr ningn riesgo. Esto es diferente en cada persona. Su nutricionista puede ayudarlo a calcular la cantidad de carbohidratos que debe ingerir en cada comida y en cada refrigerio. Cmo me afecta el alcohol? El alcohol puede provocar una disminucin de la glucemia (hipoglucemia), especialmente si usa insulina o toma determinados medicamentos por va oral para la diabetes. La hipoglucemia es una afeccin potencialmente mortal. Los sntomas de la hipoglucemia, como somnolencia, mareos y confusin, son  similares a los sntomas de haber consumido demasiado alcohol. No beba alcohol si: Su mdico le indica no hacerlo. Est embarazada, puede estar embarazada o est tratando de quedar embarazada. Si bebe alcohol: Limite la cantidad que bebe a lo siguiente: De 0 a 1 medida por da para las mujeres. De 0 a 2 medidas por da para los hombres. Sepa cunta cantidad de alcohol hay en las bebidas que toma. En los [place], una medida equivale a una botella de cerveza de 12 oz (355 ml), un vaso de vino de 5 oz (148 ml) o un vaso de una bebida alcohlica de alta graduacin de 1 oz (44 ml). Mantngase hidratado bebiendo agua, refrescos dietticos o t helado sin azcar. Tenga en cuenta que los refrescos comunes, los jugos y otras bebidas para mezclar pueden contener mucha azcar y se deben contar como carbohidratos. Consejos para seguir este plan  Leer las etiquetas de los alimentos Comience por leer el tamao de la porcin en la etiqueta de Informacin nutricional de los alimentos envasados y las bebidas. La cantidad de caloras, carbohidratos, grasas y otros nutrientes detallados en la etiqueta se basan en una porcin del alimento. Muchos alimentos contienen ms de una porcin por envase. Verifique la cantidad total de gramos (g) de carbohidratos totales en una porcin. Verifique la cantidad de gramos de grasas saturadas y grasas trans en una porcin. Escoja alimentos que no contengan estas grasas o que su contenido de estas sea bajo. Verifique la cantidad de miligramos (mg) de sal (sodio) en una porcin. La mayora de las personas deben limitar la ingesta de sodio total a menos de 2300 mg por da. Siempre consulte la informacin nutricional de los alimentos etiquetados como "con bajo contenido de grasa" o "sin grasa".   Estos alimentos pueden tener un mayor contenido de azcar agregada o carbohidratos refinados, y deben evitarse. Hable con su nutricionista para identificar sus objetivos diarios en  cuanto a los nutrientes mencionados en la etiqueta. Al ir de compras Evite comprar alimentos procesados, enlatados o precocidos. Estos alimentos tienden a tener una mayor cantidad de grasa, sodio y azcar agregada. Compre en la zona exterior de la tienda de comestibles. Esta es la zona donde se encuentran con mayor frecuencia las frutas y las verduras frescas, los cereales a granel, las carnes frescas y los productos lcteos frescos. Al cocinar Use mtodos de coccin a baja temperatura, como hornear, en lugar de mtodos de coccin a alta temperatura, como frer en abundante aceite. Cocine con aceites saludables, como el aceite de oliva, canola o girasol. Evite cocinar con manteca, crema o carnes con alto contenido de grasa. Planificacin de las comidas Coma las comidas y los refrigerios regularmente, preferentemente a la misma hora todos los das. Evite pasar largos perodos de tiempo sin comer. Consuma alimentos ricos en fibra, como frutas frescas, verduras, frijoles y cereales integrales. Consuma entre 4 y 6 onzas (entre 112 y 168 g) de protenas magras por da, como carnes magras, pollo, pescado, huevos o tofu. Una onza (oz) (28 g) de protena magra equivale a: 1 onza (28 g) de carne, pollo o pescado. 1 huevo.  taza (62 g) de tofu. Coma algunos alimentos por da que contengan grasas saludables, como aguacates, frutos secos, semillas y pescado. Qu alimentos debo comer? Frutas Bayas. Manzanas. Naranjas. Duraznos. Damascos. Ciruelas. Uvas. Mangos. Papayas. Granadas. Kiwi. Cerezas. Verduras Verduras de hoja verde, que incluyen lechuga, espinaca, col rizada, acelga, hojas de berza, hojas de mostaza y repollo. Remolachas. Coliflor. Brcoli. Zanahorias. Judas verdes. Tomates. Pimientos. Cebollas. Pepinos. Coles de Bruselas. Granos Granos integrales, como panes, galletas, tortillas, cereales y pastas de salvado o integrales. Avena sin azcar. Quinua. Arroz integral o salvaje. Carnes y otras  protenas Frutos de mar. Carne de ave sin piel. Cortes magros de ave y carne de res. Tofu. Frutos secos. Semillas. Lcteos Productos lcteos sin grasa o con bajo contenido de grasa, como leche, yogur y queso. Es posible que los productos detallados arriba no constituyan una lista completa de los alimentos y las bebidas que puede tomar. Consulte a un nutricionista para obtener ms informacin. Qu alimentos debo evitar? Frutas Frutas enlatadas al almbar. Verduras Verduras enlatadas. Verduras congeladas con mantequilla o salsa de crema. Granos Productos elaborados con harina y harina blanca refinada, como panes, pastas, bocadillos y cereales. Evite todos los alimentos procesados. Carnes y otras protenas Cortes de carne con alto contenido de grasa. Carne de ave con piel. Carnes empanizadas o fritas. Carne procesada. Evite las grasas saturadas. Lcteos Yogur, queso o leche enteros. Bebidas Bebidas azucaradas, como gaseosas o t helado. Es posible que los productos que se enumeran ms arriba no constituyan una lista completa de los alimentos y las bebidas que debe evitar. Consulte a un nutricionista para obtener ms informacin. Preguntas para hacerle al mdico Debo consultar con un especialista certificado en atencin y educacin sobre la diabetes? Es necesario que me rena con un nutricionista? A qu nmero puedo llamar si tengo preguntas? Cules son los mejores momentos para controlar la glucemia? Dnde encontrar ms informacin: American Diabetes Association (Asociacin Estadounidense de la Diabetes): diabetes.org Academy of Nutrition and Dietetics (Academia de Nutricin y Diettica): eatright.org National Institute of Diabetes and Digestive and Kidney Diseases (Instituto Nacional de la Diabetes y las Enfermedades Digestivas y Renales): niddk.nih.gov Association of Diabetes   Care & Education Specialists (Asociacin de Especialistas en Atencin y Educacin sobre la Diabetes):  diabeteseducator.org Resumen Es importante tener hbitos alimenticios saludables debido a que sus niveles de azcar en la sangre (glucosa) se ven afectados en gran medida por lo que come y bebe. Es importante consumir alcohol con prudencia. Un plan de comidas saludable lo ayudar a controlar la glucosa en sangre y a reducir el riesgo de enfermedades cardacas. El mdico puede recomendarle que trabaje con un nutricionista para elaborar el mejor plan para usted. Esta informacin no tiene como fin reemplazar el consejo del mdico. Asegrese de hacerle al mdico cualquier pregunta que tenga. Document Revised: 03/15/2020 Document Reviewed: 03/15/2020 Elsevier Patient Education  2024 Elsevier Inc.  

## 2023-07-04 LAB — LP+NON-HDL CHOLESTEROL
Cholesterol, Total: 140 mg/dL (ref 100–199)
HDL: 41 mg/dL (ref >39)
LDL Chol Calc (NIH): 64 mg/dL (ref 0–99)
Total Non-HDL-Chol (LDL+VLDL): 99 mg/dL (ref 0–129)
Triglycerides: 213 mg/dL — ABNORMAL HIGH (ref 0–149)
VLDL Cholesterol Cal: 35 mg/dL (ref 5–40)

## 2023-07-04 LAB — CMP14+EGFR
ALT: 14 [IU]/L (ref 0–32)
AST: 30 [IU]/L (ref 0–40)
Albumin: 4.4 g/dL (ref 3.8–4.9)
Alkaline Phosphatase: 70 [IU]/L (ref 44–121)
BUN/Creatinine Ratio: 14 (ref 9–23)
BUN: 8 mg/dL (ref 6–24)
Bilirubin Total: 0.4 mg/dL (ref 0.0–1.2)
CO2: 16 mmol/L — ABNORMAL LOW (ref 20–29)
Calcium: 9.3 mg/dL (ref 8.7–10.2)
Chloride: 107 mmol/L — ABNORMAL HIGH (ref 96–106)
Creatinine, Ser: 0.57 mg/dL (ref 0.57–1.00)
Globulin, Total: 3.2 g/dL (ref 1.5–4.5)
Glucose: 146 mg/dL — ABNORMAL HIGH (ref 70–99)
Potassium: 4.4 mmol/L (ref 3.5–5.2)
Sodium: 142 mmol/L (ref 134–144)
Total Protein: 7.6 g/dL (ref 6.0–8.5)
eGFR: 109 mL/min/{1.73_m2} (ref >59)

## 2023-07-04 LAB — MICROALBUMIN / CREATININE URINE RATIO
Creatinine, Urine: 179.2 mg/dL
Microalb/Creat Ratio: 7 mg/g{creat} (ref 0–29)
Microalbumin, Urine: 12.5 ug/mL

## 2023-07-10 ENCOUNTER — Telehealth: Payer: Self-pay

## 2023-07-10 NOTE — Telephone Encounter (Signed)
-----   Message from [place] sent at 07/07/2023 12:15 PM EST ----- Please inform her that her kidney and liver functions are normal, cholesterol is normal and has improved significantly compared to previous labs.  Continue current medications.  Thank you.

## 2023-07-10 NOTE — Telephone Encounter (Signed)
Pt was called and is aware of results, DOB was confirmed    Interpreter: Mikle Bosworth  Number: [phone]

## 2023-10-07 DIAGNOSIS — E042 Nontoxic multinodular goiter: Secondary | ICD-10-CM | POA: Diagnosis not present

## 2023-10-09 ENCOUNTER — Other Ambulatory Visit (HOSPITAL_COMMUNITY): Payer: Self-pay | Admitting: Otolaryngology

## 2023-10-09 DIAGNOSIS — E042 Nontoxic multinodular goiter: Secondary | ICD-10-CM

## 2023-10-15 ENCOUNTER — Ambulatory Visit (HOSPITAL_COMMUNITY)
Admission: RE | Admit: 2023-10-15 | Discharge: 2023-10-15 | Disposition: A | Discharge status: Home / Self Care | Source: Ambulatory Visit | Attending: Otolaryngology | Admitting: Otolaryngology

## 2023-10-15 DIAGNOSIS — E042 Nontoxic multinodular goiter: Secondary | ICD-10-CM | POA: Diagnosis not present

## 2023-10-27 DIAGNOSIS — E042 Nontoxic multinodular goiter: Secondary | ICD-10-CM | POA: Diagnosis not present

## 2023-12-29 ENCOUNTER — Other Ambulatory Visit: Payer: Self-pay | Admitting: Family Medicine

## 2023-12-29 DIAGNOSIS — E119 Type 2 diabetes mellitus without complications: Secondary | ICD-10-CM

## 2023-12-29 DIAGNOSIS — E1142 Type 2 diabetes mellitus with diabetic polyneuropathy: Secondary | ICD-10-CM

## 2023-12-30 NOTE — Telephone Encounter (Signed)
 Requested by interface surescripts. Courtesy refill future visit in 2 days.  Requested Prescriptions  Pending Prescriptions Disp Refills   metFORMIN  (GLUCOPHAGE ) 500 MG tablet [Pharmacy Med Name: metFORMIN  HCl 500 MG Oral Tablet] 180 tablet 0    Sig: TAKE 2 TABLETS BY MOUTH TWICE DAILY WITH MEALS --DOSE  INCREASE     Endocrinology:  Diabetes - Biguanides Failed - 12/30/2023  2:59 PM      Failed - HBA1C is between 0 and 7.9 and within 180 days    HbA1c, POC (controlled diabetic range)  Date Value Ref Range Status  07/02/2023 7.7 (A) 0.0 - 7.0 % Final         Failed - B12 Level in normal range and within 720 days    No results found for: "VITAMINB12"       Failed - Valid encounter within last 6 months    Recent Outpatient Visits           6 months ago Type 2 diabetes mellitus with diabetic polyneuropathy, without long-term current use of insulin (HCC)   [facility] Dept Of [place] Joaquin Mulberry, MD   1 year ago Type 2 diabetes mellitus with diabetic polyneuropathy, without long-term current use of insulin (HCC)   [facility] - A Dept Of [place]. [facility] Joaquin Mulberry, MD   1 year ago Type 2 diabetes mellitus with diabetic polyneuropathy, without long-term current use of insulin (HCC)   [facility] - A Dept Of [place]. [facility] Joaquin Mulberry, MD   1 year ago Type 2 diabetes mellitus with diabetic polyneuropathy, without long-term current use of insulin (HCC)   [facility] - A Dept Of [place]. [facility] Joaquin Mulberry, MD   2 years ago Type 2 diabetes mellitus with diabetic polyneuropathy, without long-term current use of insulin (HCC)   [facility] - A Dept Of [place]. [facility] Joaquin Mulberry, MD       Future Appointments             In 2 days Joaquin Mulberry, MD [facility] [place]  - A Dept Of [place]. [facility]            Failed - CBC within normal limits and completed in the last 12 months    WBC  Date Value Ref Range Status  08/12/2016 6.2 3.8 - 10.8 K/uL Final   RBC  Date Value Ref Range Status  08/12/2016 4.87 3.80 - 5.10 MIL/uL Final   Hemoglobin  Date Value Ref Range Status  08/12/2016 15.1 11.7 - 15.5 g/dL Final   HCT  Date Value Ref Range Status  08/12/2016 44.6 35.0 - 45.0 % Final   MCHC  Date Value Ref Range Status  08/12/2016 33.9 32.0 - 36.0 g/dL Final   [facility]  Date Value Ref Range Status  08/12/2016 31.0 27.0 - 33.0 pg Final   MCV  Date Value Ref Range Status  08/12/2016 91.6 80.0 - 100.0 fL Final   No results found for: "PLTCOUNTKUC", "LABPLAT", "POCPLA" RDW  Date Value Ref Range Status  08/12/2016 12.9 11.0 - 15.0 % Final         Passed - Cr in normal range and within 360 days    Creat  Date Value Ref Range Status  08/12/2016 0.49 (L) 0.50 - 1.10 mg/dL Final  Creatinine, Ser  Date Value Ref Range Status  07/02/2023 0.57 0.57 - 1.00 mg/dL Final         Passed - eGFR in normal range and within 360 days    GFR, Est African American  Date Value Ref Range Status  08/12/2016 >89 >=60 mL/min Final   GFR calc Af Amer  Date Value Ref Range Status  05/09/2020 126 >59 mL/min/1.73 Final    Comment:    **In accordance with recommendations from the NKF-ASN Task force,**   [facility] is in the process of updating its eGFR calculation to the   2021 CKD-EPI creatinine equation that estimates kidney function   without a race variable.    GFR, Est Non African American  Date Value Ref Range Status  08/12/2016 >89 >=60 mL/min Final   GFR calc non Af Amer  Date Value Ref Range Status  05/09/2020 109 >59 mL/min/1.73 Final   eGFR  Date Value Ref Range Status  07/02/2023 109 >59 mL/min/1.73 Final         Signed Prescriptions Disp Refills   ACCU-CHEK GUIDE TEST test strip 100 each 0    Sig: USE 1 STRIP TO CHECK  GLUCOSE ONCE DAILY BEFORE BREAKFAST     There is no refill protocol information for this order

## 2023-12-30 NOTE — Telephone Encounter (Signed)
 Requested by interface surescripts. Future visit in 2 days .  Requested Prescriptions  Pending Prescriptions Disp Refills   metFORMIN  (GLUCOPHAGE ) 500 MG tablet [Pharmacy Med Name: metFORMIN  HCl 500 MG Oral Tablet] 180 tablet 0    Sig: TAKE 2 TABLETS BY MOUTH TWICE DAILY WITH MEALS --DOSE  INCREASE     Endocrinology:  Diabetes - Biguanides Failed - 12/30/2023  2:57 PM      Failed - HBA1C is between 0 and 7.9 and within 180 days    HbA1c, POC (controlled diabetic range)  Date Value Ref Range Status  07/02/2023 7.7 (A) 0.0 - 7.0 % Final         Failed - B12 Level in normal range and within 720 days    No results found for: "VITAMINB12"       Failed - Valid encounter within last 6 months    Recent Outpatient Visits           6 months ago Type 2 diabetes mellitus with diabetic polyneuropathy, without long-term current use of insulin (HCC)   [facility] Dept Of [place]. [facility] Joaquin Mulberry, MD   1 year ago Type 2 diabetes mellitus with diabetic polyneuropathy, without long-term current use of insulin (HCC)   [facility] - [facility] Dept Of [place]. [facility] Joaquin Mulberry, MD   1 year ago Type 2 diabetes mellitus with diabetic polyneuropathy, without long-term current use of insulin (HCC)   [facility] [place] Dept Of [place]. [facility] Joaquin Mulberry, MD   1 year ago Type 2 diabetes mellitus with diabetic polyneuropathy, without long-term current use of insulin (HCC)   [facility] - [facility] Dept Of [place]. [facility] Joaquin Mulberry, MD   2 years ago Type 2 diabetes mellitus with diabetic polyneuropathy, without long-term current use of insulin (HCC)    [facility] Dept Of [place]. [facility] Joaquin Mulberry, MD       Future Appointments             In 2 days Joaquin Mulberry, MD [facility] [place] - A Dept Of  Tommas Frag[facility]. [facility]            Failed - CBC within normal limits and completed in the last 12 months    WBC  Date Value Ref Range Status  08/12/2016 6.2 3.8 - 10.8 K/uL Final   RBC  Date Value Ref Range Status  08/12/2016 4.87 3.80 - 5.10 MIL/uL Final   Hemoglobin  Date Value Ref Range Status  08/12/2016 15.1 11.7 - 15.5 g/dL Final   HCT  Date Value Ref Range Status  08/12/2016 44.6 35.0 - 45.0 % Final   MCHC  Date Value Ref Range Status  08/12/2016 33.9 32.0 - 36.0 g/dL Final   [facility]  Date Value Ref Range Status  08/12/2016 31.0 27.0 - 33.0 pg Final   MCV  Date Value Ref Range Status  08/12/2016 91.6 80.0 - 100.0 fL Final   No results found for: "PLTCOUNTKUC", "LABPLAT", "POCPLA" RDW  Date Value Ref Range Status  08/12/2016 12.9 11.0 - 15.0 % Final         Passed - Cr in normal range and within 360 days    Creat  Date Value Ref Range Status  08/12/2016 0.49 (L) 0.50 - 1.10 mg/dL Final  Creatinine, Ser  Date Value Ref Range Status  07/02/2023 0.57 0.57 - 1.00 mg/dL Final         Passed - eGFR in normal range and within 360 days    GFR, Est African American  Date Value Ref Range Status  08/12/2016 >89 >=60 mL/min Final   GFR calc Af Amer  Date Value Ref Range Status  05/09/2020 126 >59 mL/min/1.73 Final    Comment:    **In accordance with recommendations from the NKF-ASN Task force,**   [facility] is in the process of updating its eGFR calculation to the   2021 CKD-EPI creatinine equation that estimates kidney function   without a race variable.    GFR, Est Non African American  Date Value Ref Range Status  08/12/2016 >89 >=60 mL/min Final   GFR calc non Af Amer  Date Value Ref Range Status  05/09/2020 109 >59 mL/min/1.73 Final   eGFR  Date Value Ref Range Status  07/02/2023 109 >59 mL/min/1.73 Final          ACCU-CHEK GUIDE TEST test strip [Pharmacy Med Name: Accu-Chek Guide In Vitro Strip] 100 each 0    Sig: USE 1 STRIP TO  CHECK GLUCOSE ONCE DAILY BEFORE BREAKFAST     There is no refill protocol information for this order

## 2024-01-01 ENCOUNTER — Ambulatory Visit: Payer: Medicaid Other | Admitting: Family Medicine

## 2024-02-13 ENCOUNTER — Telehealth: Payer: Self-pay | Admitting: Family Medicine

## 2024-02-13 NOTE — Telephone Encounter (Signed)
 Pt confirmed appt per daneila 7/25

## 2024-02-16 ENCOUNTER — Encounter: Payer: Self-pay | Admitting: Family Medicine

## 2024-02-16 ENCOUNTER — Ambulatory Visit: Attending: Family Medicine | Admitting: Family Medicine

## 2024-02-16 VITALS — BP 125/76 | HR 79 | Wt 149.6 lb

## 2024-02-16 DIAGNOSIS — E1169 Type 2 diabetes mellitus with other specified complication: Secondary | ICD-10-CM | POA: Diagnosis not present

## 2024-02-16 DIAGNOSIS — Z1211 Encounter for screening for malignant neoplasm of colon: Secondary | ICD-10-CM

## 2024-02-16 DIAGNOSIS — L80 Vitiligo: Secondary | ICD-10-CM

## 2024-02-16 DIAGNOSIS — Z7984 Long term (current) use of oral hypoglycemic drugs: Secondary | ICD-10-CM

## 2024-02-16 DIAGNOSIS — Z1231 Encounter for screening mammogram for malignant neoplasm of breast: Secondary | ICD-10-CM

## 2024-02-16 DIAGNOSIS — M19042 Primary osteoarthritis, left hand: Secondary | ICD-10-CM

## 2024-02-16 DIAGNOSIS — E785 Hyperlipidemia, unspecified: Secondary | ICD-10-CM

## 2024-02-16 DIAGNOSIS — M19041 Primary osteoarthritis, right hand: Secondary | ICD-10-CM

## 2024-02-16 DIAGNOSIS — E1142 Type 2 diabetes mellitus with diabetic polyneuropathy: Secondary | ICD-10-CM

## 2024-02-16 DIAGNOSIS — E119 Type 2 diabetes mellitus without complications: Secondary | ICD-10-CM

## 2024-02-16 LAB — POCT GLYCOSYLATED HEMOGLOBIN (HGB A1C): HbA1c, POC (controlled diabetic range): 7 % (ref 0.0–7.0)

## 2024-02-16 LAB — GLUCOSE, POCT (MANUAL RESULT ENTRY): POC Glucose: 124 mg/dL — AB (ref 70–99)

## 2024-02-16 MED ORDER — ACCU-CHEK GUIDE TEST VI STRP: ORAL_STRIP | 0 refills | Status: AC

## 2024-02-16 MED ORDER — ATORVASTATIN CALCIUM 40 MG PO TABS: 40.0000 mg | ORAL_TABLET | Freq: Every day | ORAL | 1 refills | Status: AC

## 2024-02-16 MED ORDER — DICLOFENAC SODIUM 1 % EX GEL: 2.0000 g | Freq: Four times a day (QID) | CUTANEOUS | 1 refills | Status: AC

## 2024-02-16 MED ORDER — METFORMIN HCL 500 MG PO TABS: 1000.0000 mg | ORAL_TABLET | Freq: Two times a day (BID) | ORAL | 0 refills | Status: DC

## 2024-02-16 MED ORDER — ACCU-CHEK SOFTCLIX LANCETS MISC: 0 refills | Status: AC

## 2024-02-16 NOTE — Patient Instructions (Signed)
 Manchas descoloridas en la piel (vitiligo): Qu debe saber Patches of Discolored Skin (Vitiligo): What to Know El vitiligo es una enfermedad de la piel a largo plazo (crnica), que causa manchas descoloridas en la piel. Estas manchas se ponen de color blanco lechoso porque la piel pierde su color natural. Algunas personas tambin pueden perder color en el cabello, los ojos o el interior de la boca. Hay dos tipos principales de vitiligo: Vitiligo no segmentario o bilateral. Este tipo afecta a ambos lados del cuerpo, con frecuencia en la cara, las manos, los brazos, las rodillas o los pies. Puede irse y volver. Vitiligo segmentario o unilateral. Este tipo afecta solo a un lado del cuerpo, a menudo en la cara, el brazo o la pierna. Algunas personas tambin pierden el color del cabello. Puede empeorar durante un tiempo y [employer]. El vitiligo puede afectar a Emergency planning/management officer, pero es ms perceptible en las personas con tonos de piel ms oscuros. No puede transmitirse de burkina faso persona a [place]. Pero puede causar estrs emocional. Cules son las causas? La causa no se conoce. Este puede incluir lo siguiente: El sistema de defensa del cuerpo, o sistema inmunitario, ataca las clulas de la piel que producen color. Esto es una enfermedad autoinmunitaria. Su gentica. El vitiligo puede ser hereditario. Qu incrementa el riesgo? Tener antecedentes familiares de vitiligo. Tener otra enfermedad autoinmunitaria, como artritis reumatoide, enfermedad tiroidea autoinmunitaria o diabetes tipo 1. Cules son los signos o sntomas?  El sntoma principal del vitiligo es la prdida de color en la piel, el cabello, los ojos y el interior de la boca. Algunas personas con vitiligo pueden correr un mayor riesgo de perder el odo o la vista. Cmo se diagnostica? El mdico puede hacerle un examen fsico de la piel para Editor, commissioning. Tambin le pueden hacer anlisis de sangre para comprobar si tiene  otras enfermedades autoinmunitarias. Cmo se trata? No tiene [place], pero los tratamientos pueden ayudar. Estos pueden incluir los siguientes: Lociones de [place] o autobronceador para Herbalist a las zonas que lo han perdido. Tinte de cabello para agregar color a los mechones blancos en el cabello. Cremas para la piel, como las cremas con corticoesteroides o vitamina D, para recuperar algo de color. Tratamientos con sinclair matin. Medicamentos para aclarar la piel ms oscura y [employer] las manchas blancas sean menos visibles. Ciruga de injerto de piel para transferir piel ms oscura a las zonas blancas con parches. Tatuaje quirrgico para Herbalist a las Psychiatrist. Siga estas indicaciones en su casa: Use los medicamentos nicamente segn las indicaciones. Proteja su piel del sol. Las quemaduras solares pueden empeorar el vitiligo. Use una pantalla con factor de proteccin solar (FPS) de al menos 30. Cubra la piel afectada con ropa o use sombrero cuando est afuera. Controle el estrs. El estrs puede empeorar el vitiligo. Obtenga asesoramiento y apoyo si el vitiligo tiene un impacto en sus emociones. Concurra a todas las visitas de seguimiento para controlar su afeccin. Comunquese con un mdico si: Su afeccin empeora. Est lidiando con sntomas de depresin o ansiedad. Nota cambios en el odo o la vista. Esta informacin no tiene Theme park manager el consejo del mdico. Asegrese de hacerle al mdico cualquier pregunta que tenga. Document Revised: 03/25/2023 Document Reviewed: 03/25/2023 Elsevier Patient Education  2024 [employer].

## 2024-02-16 NOTE — Progress Notes (Signed)
 Subjective:  Patient ID: Debbie Lawson, female    DOB: August 16, 1970  Age: 53 y.o. MRN: [id number]  CC: Diabetes     Discussed the use of AI scribe software for clinical note transcription with the patient, who gave verbal consent to proceed.  History of Present Illness Debbie Lawson is a 53 year old female with  a history of  type 2 diabetes mellitus , multiple thyroid  nodules   who presents with worsening finger pain.  She experiences increased pain in her fingers, particularly at the top.  Denies using any medications for this. Her diabetes management is improving, with her A1c decreasing from 7.7 in December to 7.0 currently. She has no means to check her blood sugar levels as she left her monitoring device in [place].  She needs to schedule a mammogram and has already scheduled an eye exam. She prefers a stool test in a box for colon cancer screening over a colonoscopy.  She has noticed patches on her skin that worsen with sun exposure, described as 'spots growing with the sun'. She has not seen a dermatologist for this issue. She was last seen by ENT for management of multiple thyroid  nodules and per ENT note from 10/2023 ultrasound revealed benign and stable on latest imaging with no repeat biopsy indicated and no role for surgery.   Past Medical History:  Diagnosis Date   Hyperlipidemia 04/15/2017    Past Surgical History:  Procedure Laterality Date   NO PAST SURGERIES      Family History  Problem Relation Age of Onset   Diabetes Mother    Breast cancer Sister 46    Social History   Socioeconomic History   Marital status: Married    Spouse name: Not on file   Number of children: Not on file   Years of education: Not on file   Highest education level: Not on file  Occupational History   Not on file  Tobacco Use   Smoking status: Never   Smokeless tobacco: Never  Vaping Use   Vaping status: Never Used  Substance and Sexual Activity   Alcohol  use: No   Drug use: No   Sexual activity: Yes  Other Topics Concern   Not on file  Social History Narrative   Not on file   Social Drivers of Health   Financial Resource Strain: Not on file  Food Insecurity: Not on file  Transportation Needs: Not on file  Physical Activity: Not on file  Stress: Not on file  Social Connections: Not on file    No Known Allergies  Outpatient Medications Prior to Visit  Medication Sig Dispense Refill   Blood Glucose Monitoring Suppl (ACCU-CHEK AVIVA) device Use as instructed daily. 1 each 0   Blood Glucose Monitoring Suppl (ACCU-CHEK GUIDE ME) w/Device KIT Use to check blood sugar one daily. E11.9 1 kit 0   Cholecalciferol (VITAMIN D PO) Take 1 tablet by mouth daily.     clotrimazole  (LOTRIMIN ) 1 % cream Apply 1 application topically 2 (two) times daily. 45 g 1   ibuprofen  (ADVIL ) 800 MG tablet Take 1 tablet (800 mg total) by mouth 3 (three) times daily as needed. for pain 60 tablet 0   ACCU-CHEK GUIDE TEST test strip USE 1 STRIP TO CHECK GLUCOSE ONCE DAILY BEFORE BREAKFAST 100 each 0   Accu-Chek Softclix Lancets lancets USE AS DIRECTED **  NEED  OFFICE  REFILLS  FOR  MORE  REFILLS** 100 each 0   atorvastatin  (  LIPITOR) 40 MG tablet Take 1 tablet (40 mg total) by mouth daily. 90 tablet 1   diclofenac  Sodium (VOLTAREN ) 1 % GEL Apply 2 g topically 4 (four) times daily. 100 g 1   metFORMIN  (GLUCOPHAGE ) 500 MG tablet TAKE 2 TABLETS BY MOUTH TWICE DAILY WITH MEALS --DOSE  INCREASE 180 tablet 0   No facility-administered medications prior to visit.     ROS Review of Systems  Constitutional:  Negative for activity change and appetite change.  HENT:  Negative for sinus pressure and sore throat.   Respiratory:  Negative for chest tightness, shortness of breath and wheezing.   Cardiovascular:  Negative for chest pain and palpitations.  Gastrointestinal:  Negative for abdominal distention, abdominal pain and constipation.  Genitourinary: Negative.    Musculoskeletal: Negative.   Skin:  Positive for color change.  Psychiatric/Behavioral:  Negative for behavioral problems and dysphoric mood.     Objective:  BP 125/76 (BP Location: Right Arm, Patient Position: Sitting, Cuff Size: Normal)   Pulse 79   Wt 149 lb 9.6 oz (67.9 kg)   SpO2 99%   BMI 28.27 kg/m      02/16/2024    3:06 PM 02/16/2024    3:05 PM 07/02/2023    9:41 AM  BP/Weight  Systolic BP 125  862  Diastolic BP 76  80  Wt. (Lbs)  149.6 151.8  BMI  28.27 kg/m2 28.68 kg/m2      Physical Exam Constitutional:      Appearance: She is well-developed.  Cardiovascular:     Rate and Rhythm: Normal rate.     Heart sounds: Normal heart sounds. No murmur heard. Pulmonary:     Effort: Pulmonary effort is normal.     Breath sounds: Normal breath sounds. No wheezing or rales.  Chest:     Chest wall: No tenderness.  Abdominal:     General: Bowel sounds are normal. There is no distension.     Palpations: Abdomen is soft. There is no mass.     Tenderness: There is no abdominal tenderness.  Musculoskeletal:        General: Normal range of motion.     Right lower leg: No edema.     Left lower leg: No edema.  Skin:    Comments: Hypopigmented patches on wrist of both hands  Neurological:     Mental Status: She is alert and oriented to person, place, and time.  Psychiatric:        Mood and Affect: Mood normal.        Latest Ref Rng & Units 07/02/2023   10:21 AM 12/25/2022    9:58 AM 03/26/2022   12:30 PM  CMP  Glucose 70 - 99 mg/dL 853  891  873   BUN 6 - 24 mg/dL 8  9  9    Creatinine 0.57 - 1.00 mg/dL 9.42  9.48  9.46   Sodium 134 - 144 mmol/L 142  141  139   Potassium 3.5 - 5.2 mmol/L 4.4  4.8  4.4   Chloride 96 - 106 mmol/L 107  103  104   CO2 20 - 29 mmol/L 16  20  19    Calcium  8.7 - 10.2 mg/dL 9.3  9.4  9.2   Total Protein 6.0 - 8.5 g/dL 7.6  6.8  7.1   Total Bilirubin 0.0 - 1.2 mg/dL 0.4  <9.7  0.6   Alkaline Phos 44 - 121 IU/L 70  68  66   AST 0 - 40  IU/L  30  15  17    ALT 0 - 32 IU/L 14  9  12      Lipid Panel     Component Value Date/Time   CHOL 140 07/02/2023 1021   TRIG 213 (H) 07/02/2023 1021   HDL 41 07/02/2023 1021   CHOLHDL 3.3 01/19/2021 0950   CHOLHDL 5.4 (H) 08/12/2016 0932   VLDL 27 07/11/2010 2115   LDLCALC 64 07/02/2023 1021    CBC    Component Value Date/Time   WBC 6.2 08/12/2016 0932   RBC 4.87 08/12/2016 0932   HGB 15.1 08/12/2016 0932   HCT 44.6 08/12/2016 0932   PLT 331 08/12/2016 0932   MCV 91.6 08/12/2016 0932   MCH 31.0 08/12/2016 0932   MCHC 33.9 08/12/2016 0932   RDW 12.9 08/12/2016 0932   LYMPHSABS 2,170 08/12/2016 0932   MONOABS 558 08/12/2016 0932   EOSABS 124 08/12/2016 0932   BASOSABS 0 08/12/2016 0932    Lab Results  Component Value Date   HGBA1C 7.0 02/16/2024   Lab Results  Component Value Date   HGBA1C 7.0 02/16/2024   HGBA1C 7.7 (A) 07/02/2023   HGBA1C 5.8 12/25/2022        Assessment & Plan Arthritis Increased finger pain likely due to arthritis. Non-compliance with prescribed topical treatment. - Prescribe Voltaren  gel for finger pain and inflammation.  Type 2 Diabetes Mellitus Diabetes management improving with Hemoglobin A1c reduced from 7.7% to 7.0%. - Provide new prescription for blood glucose monitoring device.  Vitiligo Worsening skin patches with sun exposure. No prior dermatological evaluation. - Refer to dermatologist for evaluation and management. - Advise to avoid extreme sun exposure.  Hyperlipidemia associated with type 2 diabetes mellitus Elevated triglyceride LDL at goal Continue statin   General Health Maintenance/screening for breast cancer/screening for colon cancer Requires routine screenings. Eye exam scheduled. Needs mammogram and prefers stool test kit for colon cancer screening. - Order mammogram. - Provide stool test kit for colon cancer screening.   Meds ordered this encounter  Medications   atorvastatin  (LIPITOR) 40 MG tablet     Sig: Take 1 tablet (40 mg total) by mouth daily.    Dispense:  90 tablet    Refill:  1   diclofenac  Sodium (VOLTAREN ) 1 % GEL    Sig: Apply 2 g topically 4 (four) times daily.    Dispense:  100 g    Refill:  1   metFORMIN  (GLUCOPHAGE ) 500 MG tablet    Sig: Take 2 tablets (1,000 mg total) by mouth 2 (two) times daily with a meal.    Dispense:  180 tablet    Refill:  0   glucose blood (ACCU-CHEK GUIDE TEST) test strip    Sig: Use as instructed    Dispense:  100 each    Refill:  0   Accu-Chek Softclix Lancets lancets    Sig: Use as instructed    Dispense:  100 each    Refill:  0    Follow-up: Return in about 6 months (around 08/18/2024) for Chronic medical conditions.       Corrina Sabin, MD, FAAFP. [facility] [place], [place] [phone]   02/16/2024, 3:50 PM

## 2024-02-17 ENCOUNTER — Ambulatory Visit: Attending: Family Medicine

## 2024-02-17 DIAGNOSIS — E1142 Type 2 diabetes mellitus with diabetic polyneuropathy: Secondary | ICD-10-CM | POA: Diagnosis not present

## 2024-02-18 LAB — LP+NON-HDL CHOLESTEROL
Cholesterol, Total: 84 mg/dL — ABNORMAL LOW (ref 100–199)
HDL: 31 mg/dL — ABNORMAL LOW (ref >39)
LDL Chol Calc (NIH): 30 mg/dL (ref 0–99)
Total Non-HDL-Chol (LDL+VLDL): 53 mg/dL (ref 0–129)
Triglycerides: 127 mg/dL (ref 0–149)
VLDL Cholesterol Cal: 23 mg/dL (ref 5–40)

## 2024-02-18 LAB — CMP14+EGFR
ALT: 10 IU/L (ref 0–32)
AST: 17 IU/L (ref 0–40)
Albumin: 4.3 g/dL (ref 3.8–4.9)
Alkaline Phosphatase: 79 IU/L (ref 44–121)
BUN/Creatinine Ratio: 13 (ref 9–23)
BUN: 7 mg/dL (ref 6–24)
Bilirubin Total: 0.5 mg/dL (ref 0.0–1.2)
CO2: 21 mmol/L (ref 20–29)
Calcium: 9.1 mg/dL (ref 8.7–10.2)
Chloride: 100 mmol/L (ref 96–106)
Creatinine, Ser: 0.56 mg/dL — ABNORMAL LOW (ref 0.57–1.00)
Globulin, Total: 2.6 g/dL (ref 1.5–4.5)
Glucose: 127 mg/dL — ABNORMAL HIGH (ref 70–99)
Potassium: 4.2 mmol/L (ref 3.5–5.2)
Sodium: 139 mmol/L (ref 134–144)
Total Protein: 6.9 g/dL (ref 6.0–8.5)
eGFR: 109 mL/min/1.73 (ref >59)

## 2024-02-19 ENCOUNTER — Ambulatory Visit
Admission: RE | Admit: 2024-02-19 | Discharge: 2024-02-19 | Disposition: A | Discharge status: Home / Self Care | Source: Ambulatory Visit | Attending: Family Medicine | Admitting: Family Medicine

## 2024-02-19 DIAGNOSIS — Z1231 Encounter for screening mammogram for malignant neoplasm of breast: Secondary | ICD-10-CM

## 2024-02-20 ENCOUNTER — Ambulatory Visit: Payer: Self-pay | Admitting: Family Medicine

## 2024-03-24 DIAGNOSIS — E119 Type 2 diabetes mellitus without complications: Secondary | ICD-10-CM | POA: Diagnosis not present

## 2024-03-24 DIAGNOSIS — H2513 Age-related nuclear cataract, bilateral: Secondary | ICD-10-CM | POA: Diagnosis not present

## 2024-03-24 DIAGNOSIS — H04123 Dry eye syndrome of bilateral lacrimal glands: Secondary | ICD-10-CM | POA: Diagnosis not present

## 2024-03-24 LAB — HM DIABETES EYE EXAM

## 2024-03-30 ENCOUNTER — Other Ambulatory Visit: Payer: Self-pay | Admitting: Family Medicine

## 2024-03-30 DIAGNOSIS — E1142 Type 2 diabetes mellitus with diabetic polyneuropathy: Secondary | ICD-10-CM

## 2024-03-31 NOTE — Telephone Encounter (Signed)
 Requested medication (s) are due for refill today:   No  Being requested too soon  Requested medication (s) are on the active medication list:   Yes  Future visit scheduled:   Yes 08/18/2024 with Dr. Newlin   Last ordered: 02/16/2024 #180, 0 refills  Unable to refill because CBC is overdue per protocol   Requested Prescriptions  Pending Prescriptions Disp Refills   metFORMIN  (GLUCOPHAGE ) 500 MG tablet [Pharmacy Med Name: metFORMIN  HCl 500 MG Oral Tablet] 180 tablet 0    Sig: TAKE 2 TABLETS BY MOUTH TWICE DAILY WITH MEALS     Endocrinology:  Diabetes - Biguanides Failed - 03/31/2024 12:18 PM      Failed - Cr in normal range and within 360 days    Creat  Date Value Ref Range Status  08/12/2016 0.49 (L) 0.50 - 1.10 mg/dL Final   Creatinine, Ser  Date Value Ref Range Status  02/17/2024 0.56 (L) 0.57 - 1.00 mg/dL Final         Failed - B12 Level in normal range and within 720 days    No results found for: VITAMINB12       Failed - CBC within normal limits and completed in the last 12 months    WBC  Date Value Ref Range Status  08/12/2016 6.2 3.8 - 10.8 K/uL Final   RBC  Date Value Ref Range Status  08/12/2016 4.87 3.80 - 5.10 MIL/uL Final   Hemoglobin  Date Value Ref Range Status  08/12/2016 15.1 11.7 - 15.5 g/dL Final   HCT  Date Value Ref Range Status  08/12/2016 44.6 35.0 - 45.0 % Final   MCHC  Date Value Ref Range Status  08/12/2016 33.9 32.0 - 36.0 g/dL Final   [facility]  Date Value Ref Range Status  08/12/2016 31.0 27.0 - 33.0 pg Final   MCV  Date Value Ref Range Status  08/12/2016 91.6 80.0 - 100.0 fL Final   No results found for: PLTCOUNTKUC, LABPLAT, POCPLA RDW  Date Value Ref Range Status  08/12/2016 12.9 11.0 - 15.0 % Final         Passed - HBA1C is between 0 and 7.9 and within 180 days    HbA1c, POC (controlled diabetic range)  Date Value Ref Range Status  02/16/2024 7.0 0.0 - 7.0 % Final         Passed - eGFR in normal range and within  360 days    GFR, Est African American  Date Value Ref Range Status  08/12/2016 >89 >=60 mL/min Final   GFR calc Af Amer  Date Value Ref Range Status  05/09/2020 126 >59 mL/min/1.73 Final    Comment:    **In accordance with recommendations from the NKF-ASN Task force,**   [facility] is in the process of updating its eGFR calculation to the   2021 CKD-EPI creatinine equation that estimates kidney function   without a race variable.    GFR, Est Non African American  Date Value Ref Range Status  08/12/2016 >89 >=60 mL/min Final   GFR calc non Af Amer  Date Value Ref Range Status  05/09/2020 109 >59 mL/min/1.73 Final   eGFR  Date Value Ref Range Status  02/17/2024 109 >59 mL/min/1.73 Final         Passed - Valid encounter within last 6 months    Recent Outpatient Visits           1 month ago Type 2 diabetes mellitus with diabetic polyneuropathy, without long-term current use  of insulin (HCC)   [facility] [place] - [facility] Dept Of [place]. [facility] Delbert Clam, MD   9 months ago Type 2 diabetes mellitus with diabetic polyneuropathy, without long-term current use of insulin (HCC)   [place] [facility] [place] - A Dept Of [place]. [facility] Delbert Clam, MD   1 year ago Type 2 diabetes mellitus with diabetic polyneuropathy, without long-term current use of insulin (HCC)   [facility] [place] - A Dept Of [place]. [facility] Delbert Clam, MD   1 year ago Type 2 diabetes mellitus with diabetic polyneuropathy, without long-term current use of insulin (HCC)   [facility] - [facility] Dept Of [place]. [facility] Delbert Clam, MD   2 years ago Type 2 diabetes mellitus with diabetic polyneuropathy, without long-term current use of insulin (HCC)   [place] [facility] [place] - A Dept Of [place]. [facility] Delbert Clam, MD

## 2024-06-29 ENCOUNTER — Other Ambulatory Visit: Payer: Self-pay | Admitting: Family Medicine

## 2024-06-29 DIAGNOSIS — E1142 Type 2 diabetes mellitus with diabetic polyneuropathy: Secondary | ICD-10-CM

## 2024-08-18 ENCOUNTER — Ambulatory Visit: Admitting: Family Medicine

## 2024-09-29 ENCOUNTER — Ambulatory Visit: Payer: Self-pay | Admitting: Family Medicine
